# Patient Record
Sex: Male | Born: 1960 | Race: White | Hispanic: Yes | Marital: Married | Smoking: Never smoker
Health system: Southern US, Community
[De-identification: ages and names within clinical notes are randomized; demographics above are authoritative.]

## PROBLEM LIST (undated history)

## (undated) DIAGNOSIS — Z91148 Patient's other noncompliance with medication regimen for other reason: Secondary | ICD-10-CM

## (undated) DIAGNOSIS — Z9114 Patient's other noncompliance with medication regimen: Secondary | ICD-10-CM

## (undated) DIAGNOSIS — E119 Type 2 diabetes mellitus without complications: Secondary | ICD-10-CM

## (undated) DIAGNOSIS — I10 Essential (primary) hypertension: Secondary | ICD-10-CM

## (undated) DIAGNOSIS — R748 Abnormal levels of other serum enzymes: Secondary | ICD-10-CM

## (undated) DIAGNOSIS — E785 Hyperlipidemia, unspecified: Secondary | ICD-10-CM

## (undated) HISTORY — DX: Essential (primary) hypertension: I10

## (undated) HISTORY — DX: Patient's other noncompliance with medication regimen for other reason: Z91.148

## (undated) HISTORY — DX: Hyperlipidemia, unspecified: E78.5

## (undated) HISTORY — DX: Type 2 diabetes mellitus without complications: E11.9

## (undated) HISTORY — DX: Patient's other noncompliance with medication regimen: Z91.14

## (undated) HISTORY — DX: Abnormal levels of other serum enzymes: R74.8

## (undated) HISTORY — PX: OTHER SURGICAL HISTORY: SHX169

---

## 2002-10-21 ENCOUNTER — Emergency Department (HOSPITAL_COMMUNITY): Admission: EM | Admit: 2002-10-21 | Discharge: 2002-10-21 | Payer: Self-pay | Admitting: Emergency Medicine

## 2002-10-23 ENCOUNTER — Encounter: Admission: RE | Admit: 2002-10-23 | Discharge: 2002-10-23 | Payer: Self-pay | Admitting: Internal Medicine

## 2002-11-13 ENCOUNTER — Encounter: Admission: RE | Admit: 2002-11-13 | Discharge: 2002-11-13 | Payer: Self-pay | Admitting: Internal Medicine

## 2003-06-07 ENCOUNTER — Encounter: Admission: RE | Admit: 2003-06-07 | Discharge: 2003-06-07 | Payer: Self-pay | Admitting: Internal Medicine

## 2003-07-17 ENCOUNTER — Encounter: Admission: RE | Admit: 2003-07-17 | Discharge: 2003-07-17 | Payer: Self-pay | Admitting: Internal Medicine

## 2004-12-30 ENCOUNTER — Ambulatory Visit: Payer: Self-pay | Admitting: Internal Medicine

## 2005-05-05 ENCOUNTER — Ambulatory Visit: Payer: Self-pay | Admitting: Internal Medicine

## 2005-06-17 ENCOUNTER — Emergency Department (HOSPITAL_COMMUNITY): Admission: EM | Admit: 2005-06-17 | Discharge: 2005-06-18 | Payer: Self-pay | Admitting: Emergency Medicine

## 2005-06-22 ENCOUNTER — Ambulatory Visit: Payer: Self-pay | Admitting: Hospitalist

## 2005-07-05 ENCOUNTER — Ambulatory Visit: Payer: Self-pay | Admitting: Internal Medicine

## 2005-07-05 ENCOUNTER — Ambulatory Visit (HOSPITAL_COMMUNITY): Admission: RE | Admit: 2005-07-05 | Discharge: 2005-07-05 | Payer: Self-pay | Admitting: Internal Medicine

## 2006-03-22 DIAGNOSIS — E785 Hyperlipidemia, unspecified: Secondary | ICD-10-CM

## 2006-03-22 HISTORY — DX: Hyperlipidemia, unspecified: E78.5

## 2007-12-07 ENCOUNTER — Ambulatory Visit: Payer: Self-pay | Admitting: Internal Medicine

## 2007-12-07 ENCOUNTER — Ambulatory Visit: Payer: Self-pay | Admitting: *Deleted

## 2007-12-08 ENCOUNTER — Ambulatory Visit: Payer: Self-pay | Admitting: Family Medicine

## 2008-01-18 ENCOUNTER — Ambulatory Visit: Payer: Self-pay | Admitting: Internal Medicine

## 2008-01-18 LAB — CONVERTED CEMR LAB
ALT: 40 units/L (ref 0–53)
AST: 27 units/L (ref 0–37)
Albumin: 4.5 g/dL (ref 3.5–5.2)
Alkaline Phosphatase: 80 units/L (ref 39–117)
Basophils Relative: 1 % (ref 0–1)
Calcium: 9.3 mg/dL (ref 8.4–10.5)
Chloride: 102 meq/L (ref 96–112)
Cholesterol: 169 mg/dL (ref 0–200)
HDL: 35 mg/dL — ABNORMAL LOW (ref >39)
Lymphocytes Relative: 29 % (ref 12–46)
Lymphs Abs: 3.3 10*3/uL (ref 0.7–4.0)
MCV: 84.4 fL (ref 78.0–100.0)
Monocytes Relative: 6 % (ref 3–12)
Neutro Abs: 6 10*3/uL (ref 1.7–7.7)
Platelets: 228 10*3/uL (ref 150–400)
RBC: 5.7 M/uL (ref 4.22–5.81)
RDW: 14.4 % (ref 11.5–15.5)
Total CHOL/HDL Ratio: 4.8
Triglycerides: 304 mg/dL — ABNORMAL HIGH (ref <150)
WBC: 11.4 10*3/uL — ABNORMAL HIGH (ref 4.0–10.5)

## 2008-05-20 ENCOUNTER — Ambulatory Visit: Payer: Self-pay | Admitting: Internal Medicine

## 2008-07-18 ENCOUNTER — Ambulatory Visit: Payer: Self-pay | Admitting: Internal Medicine

## 2008-08-07 ENCOUNTER — Ambulatory Visit: Payer: Self-pay | Admitting: Family Medicine

## 2009-03-11 ENCOUNTER — Ambulatory Visit: Payer: Self-pay | Admitting: Internal Medicine

## 2009-03-11 LAB — CONVERTED CEMR LAB
BUN: 7 mg/dL (ref 6–23)
CO2: 25 meq/L (ref 19–32)
Chloride: 100 meq/L (ref 96–112)
Creatinine, Ser: 0.73 mg/dL (ref 0.40–1.50)
Glucose, Bld: 207 mg/dL — ABNORMAL HIGH (ref 70–99)
Hgb A1c MFr Bld: 8.8 % — ABNORMAL HIGH (ref 4.6–6.1)
LDL Cholesterol: 111 mg/dL — ABNORMAL HIGH (ref 0–99)
Total CHOL/HDL Ratio: 4.7

## 2009-06-23 ENCOUNTER — Ambulatory Visit: Payer: Self-pay | Admitting: Internal Medicine

## 2009-10-17 ENCOUNTER — Ambulatory Visit: Payer: Self-pay | Admitting: Internal Medicine

## 2009-10-17 LAB — CONVERTED CEMR LAB
Chloride: 104 meq/L (ref 96–112)
Creatinine, Ser: 0.86 mg/dL (ref 0.40–1.50)
Glucose, Bld: 159 mg/dL — ABNORMAL HIGH (ref 70–99)

## 2012-03-22 DIAGNOSIS — I10 Essential (primary) hypertension: Secondary | ICD-10-CM

## 2012-03-22 HISTORY — DX: Essential (primary) hypertension: I10

## 2012-04-18 ENCOUNTER — Emergency Department (HOSPITAL_COMMUNITY)
Admission: EM | Admit: 2012-04-18 | Discharge: 2012-04-18 | Disposition: A | Discharge status: Home / Self Care | Payer: No Typology Code available for payment source | Source: Home / Self Care | Attending: Family Medicine | Admitting: Family Medicine

## 2012-04-18 ENCOUNTER — Encounter (HOSPITAL_COMMUNITY): Payer: Self-pay

## 2012-04-18 DIAGNOSIS — E119 Type 2 diabetes mellitus without complications: Secondary | ICD-10-CM

## 2012-04-18 DIAGNOSIS — I10 Essential (primary) hypertension: Secondary | ICD-10-CM

## 2012-04-18 DIAGNOSIS — E8881 Metabolic syndrome: Secondary | ICD-10-CM

## 2012-04-18 DIAGNOSIS — Z23 Encounter for immunization: Secondary | ICD-10-CM

## 2012-04-18 DIAGNOSIS — K029 Dental caries, unspecified: Secondary | ICD-10-CM

## 2012-04-18 LAB — COMPREHENSIVE METABOLIC PANEL
ALT: 46 U/L (ref 0–53)
AST: 30 U/L (ref 0–37)
Albumin: 4.1 g/dL (ref 3.5–5.2)
BUN: 10 mg/dL (ref 6–23)
CO2: 24 mEq/L (ref 19–32)
Chloride: 98 mEq/L (ref 96–112)
Creatinine, Ser: 0.7 mg/dL (ref 0.50–1.35)
Total Protein: 7.7 g/dL (ref 6.0–8.3)

## 2012-04-18 LAB — LIPID PANEL
Cholesterol: 189 mg/dL (ref 0–200)
HDL: 37 mg/dL — ABNORMAL LOW (ref >39)
LDL Cholesterol: 81 mg/dL (ref 0–99)
Total CHOL/HDL Ratio: 5.1 RATIO
VLDL: 71 mg/dL — ABNORMAL HIGH (ref 0–40)

## 2012-04-18 LAB — TSH: TSH: 1.368 u[IU]/mL (ref 0.350–4.500)

## 2012-04-18 MED ORDER — INFLUENZA VIRUS VACC SPLIT PF IM SUSP
0.5000 mL | Freq: Once | INTRAMUSCULAR | Status: AC
  Administered 2012-04-18: 0.5 mL via INTRAMUSCULAR

## 2012-04-18 MED ORDER — LISINOPRIL-HYDROCHLOROTHIAZIDE 10-12.5 MG PO TABS: 1.0000 | ORAL_TABLET | Freq: Every day | ORAL | Status: DC

## 2012-04-18 MED ORDER — METFORMIN HCL 1000 MG PO TABS: 1000.0000 mg | ORAL_TABLET | Freq: Two times a day (BID) | ORAL | Status: DC

## 2012-04-18 MED ORDER — GLIPIZIDE ER 10 MG PO TB24: 10.0000 mg | ORAL_TABLET | Freq: Every day | ORAL | Status: DC

## 2012-04-18 NOTE — ED Notes (Signed)
Referral faxed to [facility]- Waiting for an appt. 

## 2012-04-18 NOTE — ED Provider Notes (Signed)
History    CSN: [id number]  Arrival date & time 04/18/12  1012   First MD Initiated Contact with Patient 04/18/12 1019     Chief Complaint  Patient presents with  . Diabetes   The history is provided by the patient. The history is limited by a language barrier. A language interpreter was used.  Pt reported that he was taking glipizide and glucotrol and metformin.  He says this was prescribed by MD.  Pt did not bring any medical records with him today.  He has not been testing his blood glucose regularly.  He is not able to tell me what his blood sugars have been running.  The patient reports that he has a need for his medications.  He is completely out of both medications.  History reviewed. No pertinent past medical history.  History reviewed. No pertinent past surgical history.  No family history on file.  History  Substance Use Topics  . Smoking status: Not on file  . Smokeless tobacco: Not on file  . Alcohol Use: Not on file    Review of Systems  HENT: Positive for congestion.   Musculoskeletal: Positive for arthralgias.  Psychiatric/Behavioral: Positive for behavioral problems.  All other systems reviewed and are negative.    Allergies  Review of patient's allergies indicates no known allergies.  Home Medications   Current Outpatient Rx  Name  Route  Sig  Dispense  Refill  . GLUCOTROL PO   Oral   Take by mouth.         Marland Kitchen GLIPIZIDE PO   Oral   Take by mouth.           BP 147/91  Pulse 73  Temp 97.3 F (36.3 C) (Oral)  Resp 16  SpO2 95%  Physical Exam  Nursing note and vitals reviewed. Constitutional: He is oriented to person, place, and time. He appears well-developed and well-nourished. No distress.  HENT:  Head: Normocephalic and atraumatic.  Eyes: Conjunctivae normal and EOM are normal. Pupils are equal, round, and reactive to light.  Neck: Normal range of motion. Neck supple.  Cardiovascular: Normal rate, regular rhythm and normal heart  sounds.   Pulmonary/Chest: Effort normal and breath sounds normal.  Abdominal: Soft. Bowel sounds are normal.  Musculoskeletal: Normal range of motion. He exhibits no edema and no tenderness.  Neurological: He is alert and oriented to person, place, and time.  Skin: Skin is warm and dry.  Psychiatric: He has a normal mood and affect. His behavior is normal. Judgment and thought content normal.    ED Course  Procedures (including critical care time)  Labs Reviewed - No data to display No results found.  No diagnosis found.  MDM  IMPRESSION  Type 2 Diabetes Mellitus  Hypertension  Metabolic Syndrome   RECOMMENDATIONS / PLAN Check labs today Check A1c Metformin 1000 mg po bidac  Glucotrol XL 10 mg po daily Request Old Records Strongly encouraged glucose monitoring  FOLLOW UP 2 months  The patient was given clear instructions to go to ER or return to medical center if symptoms don't improve, worsen or new problems develop.  The patient verbalized understanding.  The patient was told to call to get lab results if they haven't heard anything in the next week.            Cleora Fleet, MD 04/18/12 1825

## 2012-04-18 NOTE — ED Notes (Signed)
[facility] history of DM needs medication refill

## 2012-04-19 NOTE — Progress Notes (Signed)
Quick Note:  Please notify patient that his diabetes is poorly controlled as evidenced by hemoglobin A1c of greater than 12%. This patient is at high-risk for acute and chronic complications of poorly controlled diabetes mellitus. It is time to think about starting the patient on insulin therapy. I would like for the patient to take his medications as prescribed at this time. I would like for him to return to the clinic in one month to be reassessed. If his blood sugars have not improved significantly then I recommend starting insulin to get his blood sugars under better control. I recommend the patient check his blood sugars 3 times per day. Write down the numbers and call our office in 10 days with his blood glucose readings. Low carbohydrate diet recommended. No concentrated sweets. No white rice. Avoid large amounts of white bread. Please refer patient to dietitian. Follow up in 1 month.    Rodney Langton, MD, CDE, FAAFP [facility] [place], [place]   ______

## 2012-10-13 ENCOUNTER — Ambulatory Visit (INDEPENDENT_AMBULATORY_CARE_PROVIDER_SITE_OTHER): Payer: No Typology Code available for payment source | Admitting: Cardiology

## 2012-10-13 ENCOUNTER — Encounter: Payer: Self-pay | Admitting: Cardiology

## 2012-10-13 VITALS — BP 134/90 | HR 88 | Ht 68.0 in | Wt 202.1 lb

## 2012-10-13 DIAGNOSIS — E785 Hyperlipidemia, unspecified: Secondary | ICD-10-CM

## 2012-10-13 DIAGNOSIS — R079 Chest pain, unspecified: Secondary | ICD-10-CM

## 2012-10-13 DIAGNOSIS — K0889 Other specified disorders of teeth and supporting structures: Secondary | ICD-10-CM | POA: Insufficient documentation

## 2012-10-13 DIAGNOSIS — R6883 Chills (without fever): Secondary | ICD-10-CM | POA: Insufficient documentation

## 2012-10-13 DIAGNOSIS — I10 Essential (primary) hypertension: Secondary | ICD-10-CM | POA: Insufficient documentation

## 2012-10-13 DIAGNOSIS — R739 Hyperglycemia, unspecified: Secondary | ICD-10-CM | POA: Insufficient documentation

## 2012-10-13 DIAGNOSIS — Z9114 Patient's other noncompliance with medication regimen: Secondary | ICD-10-CM | POA: Insufficient documentation

## 2012-10-13 DIAGNOSIS — E1165 Type 2 diabetes mellitus with hyperglycemia: Secondary | ICD-10-CM | POA: Insufficient documentation

## 2012-10-13 DIAGNOSIS — R748 Abnormal levels of other serum enzymes: Secondary | ICD-10-CM | POA: Insufficient documentation

## 2012-10-13 NOTE — Assessment & Plan Note (Signed)
Symptoms atypical but multiple risk factors. Schedule stress echocardiogram for risk stratification.

## 2012-10-13 NOTE — Patient Instructions (Addendum)
Your physician recommends that you schedule a follow-up appointment in: AS NEEDED PENDING TEST RESULTS  Your physician has requested that you have a stress echocardiogram. For further information please visit [email or web address]. Please follow instruction sheet as given.    

## 2012-10-13 NOTE — Assessment & Plan Note (Signed)
Given history of diabetes mellitus he would benefit from a statin but I will leave this to her primary care.

## 2012-10-13 NOTE — Progress Notes (Signed)
  HPI: 52 year old male for evaluation of chest pain. Note the history is obtained with the assistance of his daughter. He speaks limited [place] as he is from [place]. He has no prior cardiac history. In the past 3 months he has had 3 separate episodes of chest pain. The pain is under the left breast and described as a stabbing pain. It lasts approximately 5-10 minutes and resolves with moving his arms. It is not exertional. There is associated diaphoresis and dyspnea but no nausea. He does not have exertional chest pain and there is no dyspnea on exertion, orthopnea or pedal edema.  Current Outpatient Prescriptions  Medication Sig Dispense Refill  . aspirin 81 MG tablet Take 81 mg by mouth daily.      Marland Kitchen glipiZIDE (GLUCOTROL XL) 10 MG 24 hr tablet Take 1 tablet (10 mg total) by mouth daily.  30 tablet  3  . lisinopril-hydrochlorothiazide (PRINZIDE,ZESTORETIC) 10-12.5 MG per tablet Take 1 tablet by mouth daily.  30 tablet  3  . metFORMIN (GLUCOPHAGE) 1000 MG tablet Take 1 tablet (1,000 mg total) by mouth 2 (two) times daily with a meal.  60 tablet  3   No current facility-administered medications for this visit.    No Known Allergies  Past Medical History  Diagnosis Date  . Hypertension 2014    3 months  . Dyslipidemia 2008  . Elevated liver enzymes   . History of medication noncompliance   . Type II diabetes mellitus 7-8 years    poorly controlled    Past Surgical History  Procedure Laterality Date  . Excision ingrown toenail      partial    History   Social History  . Marital Status: Married    Spouse Name: N/A    Number of Children: 3  . Years of Education: N/A   Occupational History  .     Social History Main Topics  . Smoking status: Former Games developer  . Smokeless tobacco: Not on file  . Alcohol Use: Yes     Comment: occasional  . Drug Use: No  . Sexually Active: Not on file   Other Topics Concern  . Not on file   Social History Narrative  . No narrative on file      Family History  Problem Relation Age of Onset  . Heart disease      No family history    ROS: no fevers or chills, productive cough, hemoptysis, dysphasia, odynophagia, melena, hematochezia, dysuria, hematuria, rash, seizure activity, orthopnea, PND, pedal edema, claudication. Remaining systems are negative.  Physical Exam:   Blood pressure 134/90, pulse 88, height 5\' 8"  (1.727 m), weight 202 lb 1.9 oz (91.681 kg), SpO2 96.00%.  General:  Well developed/well nourished in NAD Skin warm/dry Patient not depressed No peripheral clubbing Back-normal HEENT-normal/normal eyelids Neck supple/normal carotid upstroke bilaterally; no bruits; no JVD; no thyromegaly chest - CTA/ normal expansion CV - RRR/normal S1 and S2; no murmurs, rubs or gallops;  PMI nondisplaced Abdomen -NT/ND, no HSM, no mass, + bowel sounds, no bruit 2+ femoral pulses, no bruits Ext-no edema, chords, 2+ DP Neuro-grossly nonfocal  ECG sinus rhythm at a rate of 76. No ST changes.

## 2012-10-13 NOTE — Assessment & Plan Note (Signed)
Continue present medications. 

## 2012-10-16 ENCOUNTER — Ambulatory Visit (HOSPITAL_COMMUNITY): Payer: Self-pay | Attending: Cardiology

## 2012-10-16 ENCOUNTER — Ambulatory Visit (HOSPITAL_BASED_OUTPATIENT_CLINIC_OR_DEPARTMENT_OTHER): Payer: Self-pay | Admitting: Radiology

## 2012-10-16 ENCOUNTER — Other Ambulatory Visit (HOSPITAL_COMMUNITY): Payer: Self-pay | Admitting: *Deleted

## 2012-10-16 ENCOUNTER — Encounter: Payer: Self-pay | Admitting: Cardiology

## 2012-10-16 DIAGNOSIS — R5381 Other malaise: Secondary | ICD-10-CM | POA: Insufficient documentation

## 2012-10-16 DIAGNOSIS — R079 Chest pain, unspecified: Secondary | ICD-10-CM

## 2012-10-16 DIAGNOSIS — R0989 Other specified symptoms and signs involving the circulatory and respiratory systems: Secondary | ICD-10-CM

## 2012-10-16 DIAGNOSIS — R072 Precordial pain: Secondary | ICD-10-CM

## 2012-10-16 DIAGNOSIS — R61 Generalized hyperhidrosis: Secondary | ICD-10-CM | POA: Insufficient documentation

## 2012-10-16 DIAGNOSIS — R110 Nausea: Secondary | ICD-10-CM | POA: Insufficient documentation

## 2012-10-16 DIAGNOSIS — E785 Hyperlipidemia, unspecified: Secondary | ICD-10-CM | POA: Insufficient documentation

## 2012-10-16 DIAGNOSIS — I10 Essential (primary) hypertension: Secondary | ICD-10-CM | POA: Insufficient documentation

## 2012-10-16 DIAGNOSIS — E669 Obesity, unspecified: Secondary | ICD-10-CM | POA: Insufficient documentation

## 2012-10-16 DIAGNOSIS — R5383 Other fatigue: Secondary | ICD-10-CM | POA: Insufficient documentation

## 2012-10-16 DIAGNOSIS — E119 Type 2 diabetes mellitus without complications: Secondary | ICD-10-CM | POA: Insufficient documentation

## 2012-10-16 DIAGNOSIS — R000 Tachycardia, unspecified: Secondary | ICD-10-CM | POA: Insufficient documentation

## 2012-10-16 MED ORDER — PERFLUTREN PROTEIN A MICROSPH IV SUSP
0.5000 mL | Freq: Once | INTRAVENOUS | Status: AC
  Administered 2012-10-16: 3 mL via INTRAVENOUS

## 2012-10-16 NOTE — Progress Notes (Signed)
Stress Echocardiogram performed with optison.  

## 2012-10-24 ENCOUNTER — Ambulatory Visit: Payer: Self-pay

## 2013-06-14 ENCOUNTER — Emergency Department (HOSPITAL_COMMUNITY)
Admission: EM | Admit: 2013-06-14 | Discharge: 2013-06-14 | Disposition: A | Discharge status: Home / Self Care | Payer: PRIVATE HEALTH INSURANCE | Source: Home / Self Care | Attending: Family Medicine | Admitting: Family Medicine

## 2013-06-14 ENCOUNTER — Encounter (HOSPITAL_COMMUNITY): Payer: Self-pay | Admitting: Emergency Medicine

## 2013-06-14 DIAGNOSIS — J40 Bronchitis, not specified as acute or chronic: Secondary | ICD-10-CM

## 2013-06-14 MED ORDER — TRAMADOL HCL 50 MG PO TABS: 50.0000 mg | ORAL_TABLET | Freq: Every evening | ORAL | Status: DC | PRN

## 2013-06-14 MED ORDER — IPRATROPIUM-ALBUTEROL 0.5-2.5 (3) MG/3ML IN SOLN
3.0000 mL | Freq: Once | RESPIRATORY_TRACT | Status: AC
  Administered 2013-06-14: 3 mL via RESPIRATORY_TRACT

## 2013-06-14 MED ORDER — PREDNISONE 10 MG PO TABS: 30.0000 mg | ORAL_TABLET | Freq: Every day | ORAL | Status: DC

## 2013-06-14 MED ORDER — IPRATROPIUM-ALBUTEROL 0.5-2.5 (3) MG/3ML IN SOLN
RESPIRATORY_TRACT | Status: AC
  Filled 2013-06-14: qty 3

## 2013-06-14 NOTE — Discharge Instructions (Signed)
Gracias por venir hoy.   Bronquitis (Bronchitis) La bronquitis es una inflamacin de las vas respiratorias que se extienden desde la trquea Lubrizol Corporationhasta los pulmones (bronquios). A menudo, la inflamacin produce la formacin de mucosidad, lo que genera tos. Si la inflamacin es grave, puede provocar falta de aire. CAUSAS  Las causas de la bronquitis pueden ser:   Infecciones virales.  Bacterias.  Humo del cigarrillo.  Alrgenos, contaminantes y otros irritantes. SIGNOS Y SNTOMAS  El sntoma ms habitual de la bronquitis es la tos frecuente con mucosidad. Otros sntomas son:  Grant RutsFiebre.  Dolores PepsiCoen el cuerpo.  Congestin en el pecho.  Escalofros.  Falta de aire.  Dolor de Advertising copywritergarganta. DIAGNSTICO  La bronquitis en general se diagnostica con la historia clnica y un examen fsico. En algunos casos se indican otros estudios, como radiografas, para Risk managerdescartar otras enfermedades.  TRATAMIENTO  Tal vez deba evitar el contacto con la causa del problema (por ejemplo, el cigarrillo). En algunos casos es Dentistnecesario administrar medicamentos. Estos pueden ser:  Antibiticos. Tal vez se los receten si la causa de la bronquitis es una bacteria.  Antitusivos. Tal vez se los receten para Eastman Kodakaliviar los sntomas de la tos.  Medicamentos inhalados. Tal vez se los receten para Museum/gallery conservatorliberar las vas respiratorias y Research officer, political partyfacilitar la respiracin.  Medicamentos con corticoides. Tal vez se los receten si tiene bronquitis recurrente (crnica). INSTRUCCIONES PARA EL CUIDADO EN EL HOGAR  Descanse lo suficiente.  Beba lquidos en abundancia para mantener la orina de color claro o amarillo plido (excepto que padezca una enfermedad que requiera la restriccin de lquidos). Tome mucho lquido para Restaurant manager, fast fooddisolver las secreciones y Statisticianevitar la deshidratacin.  Tome solo medicamentos de venta libre o recetados, segn las indicaciones del mdico.  Tome los antibiticos exclusivamente segn las indicaciones. Finalice la  prescripcin completa, aunque se sienta mejor.  Evite el humo de [address] Dud[address] Streetsegunda mano, los qumicos irritantes y los vapores fuertes. Estos agentes empeoran la bronquitis. Si es fumador, abandone el hbito. Considere el uso de goma de Theatre managermascar o la aplicacin de parches en la piel que contengan nicotina para aliviar los sntomas de abstinencia. Si deja de fumar, sus pulmones se curarn ms rpido.  Ponga un humidificador de vapor fro en la habitacin por la noche para humedecer el aire. Puede ayudarlo a aflojar la mucosidad. Cambie el agua del humidificador a diario. Tambin puede abrir el agua caliente de la ducha y sentarse en el bao con la puerta cerrada durante 5a7310minutos.  Concurra a las consultas de control con su mdico segn las indicaciones.  Lvese las manos con frecuencia para evitar contagiarse bronquitis nuevamente y para no extender la infeccin a Economistotras personas. SOLICITE ATENCIN MDICA SI: Los sntomas no mejoran despus de 1 semana de tratamiento.  SOLICITE ATENCIN MDICA DE INMEDIATO SI:  La fiebre aumenta.  Tiene escalofros.  Siente dolor en el pecho.  Le empeora la falta el aire.  La flema tiene Landingvillesangre.  Se desmaya.  Tiene vahdos.  Sufre un dolor intenso de Turkmenistancabeza.  Vomita repetidas veces. ASEGRESE DE QUE:   Comprende estas instrucciones.  Controlar su afeccin.  Recibir ayuda de inmediato si no mejora o si empeora. Document Released: 03/08/2005 Document Revised: 12/27/2012 [facility]Exit[facility] Patient Information 2014 [place]ExitCare, MarylandLLC.

## 2013-06-14 NOTE — ED Notes (Signed)
C/o cough and headache for three days  States he has congestion and pressure Does have a productive cough with white thick mucous.   No medications taking

## 2013-06-14 NOTE — ED Provider Notes (Signed)
Thomas Ryan is a 53 y.o. male who presents to Urgent Care today for cough congestion runny nose headache with mild shortness of breath and wheezing. This is been present over the past 3 days. The cough is mildly productive. He additionally notes ear congestion. He's had a few episodes of posttussive vomiting. The cough is interfering with sleep. Medications tried. Feels well otherwise.   Past Medical History  Diagnosis Date  . Hypertension 2014    3 months  . Dyslipidemia 2008  . Elevated liver enzymes   . History of medication noncompliance   . Type II diabetes mellitus 7-8 years    poorly controlled   History  Substance Use Topics  . Smoking status: Former Games developermoker  . Smokeless tobacco: Not on file  . Alcohol Use: Yes     Comment: occasional   ROS as above Medications: No current facility-administered medications for this encounter.   Current Outpatient Prescriptions  Medication Sig Dispense Refill  . aspirin 81 MG tablet Take 81 mg by mouth daily.      Marland Kitchen. glipiZIDE (GLUCOTROL XL) 10 MG 24 hr tablet Take 1 tablet (10 mg total) by mouth daily.  30 tablet  3  . lisinopril-hydrochlorothiazide (PRINZIDE,ZESTORETIC) 10-12.5 MG per tablet Take 1 tablet by mouth daily.  30 tablet  3  . metFORMIN (GLUCOPHAGE) 1000 MG tablet Take 1 tablet (1,000 mg total) by mouth 2 (two) times daily with a meal.  60 tablet  3    Exam:  BP 117/86  Pulse 96  Temp(Src) 98.6 F (37 C) (Oral)  Resp 20  SpO2 96% Gen: Well NAD HEENT: EOMI,  MMM tympanic membranes are occluded by cerumen bilaterally. Posterior pharynx is normal appearing Lungs: Normal work of breathing. CTABL Heart: RRR no MRG Abd: NABS, Soft. NT, ND Exts: Brisk capillary refill, warm and well perfused.   Patient was given DuoNeb nebulizer treatment, and felt much better. Additionally the cerumen was removed and his ears were normal appearing and felt better.  No results found for this or any previous visit (from the past 24  hour(s)). No results found.  Assessment and Plan: 53 y.o. male with bronchitis. Plan to treat with low-dose prednisone, and tramadol for cough. Followup as needed.  Discussed warning signs or symptoms. Please see discharge instructions. Patient expresses understanding.    Rodolph BongEvan S Yaqueline Gutter, MD 06/14/13 [phone]

## 2014-05-31 ENCOUNTER — Encounter (HOSPITAL_COMMUNITY): Payer: Self-pay

## 2014-05-31 ENCOUNTER — Emergency Department (INDEPENDENT_AMBULATORY_CARE_PROVIDER_SITE_OTHER)
Admission: EM | Admit: 2014-05-31 | Discharge: 2014-05-31 | Disposition: A | Discharge status: Other Acute Inpatient Hospital | Payer: No Typology Code available for payment source | Source: Home / Self Care | Attending: Family Medicine | Admitting: Family Medicine

## 2014-05-31 DIAGNOSIS — S76911A Strain of unspecified muscles, fascia and tendons at thigh level, right thigh, initial encounter: Secondary | ICD-10-CM

## 2014-05-31 DIAGNOSIS — M79674 Pain in right toe(s): Secondary | ICD-10-CM

## 2014-05-31 DIAGNOSIS — S91109S Unspecified open wound of unspecified toe(s) without damage to nail, sequela: Secondary | ICD-10-CM

## 2014-05-31 MED ORDER — TRAMADOL HCL 50 MG PO TABS: 50.0000 mg | ORAL_TABLET | Freq: Four times a day (QID) | ORAL | Status: DC | PRN

## 2014-05-31 MED ORDER — NAPROXEN 375 MG PO TABS: 375.0000 mg | ORAL_TABLET | Freq: Two times a day (BID) | ORAL | Status: DC

## 2014-05-31 NOTE — ED Provider Notes (Signed)
CSN: [id number]     Arrival date & time 05/31/14  1609 History   First MD Initiated Contact with Patient 05/31/14 1714     Chief Complaint  Patient presents with  . Rash   (Consider location/radiation/quality/duration/timing/severity/associated sxs/prior Treatment) HPI Comments: 54 year old male with type 2 diabetes mellitus has a complaint of right leg pain there are 2 sources of pain. The initial source is that of pain to the right great toe with air is a healing diabetic ulcer. He is being seen on regular basis and treatment is proceeding with that. Unfortunately he walks for about 10-12 hours a day at his job and this exacerbates the pain. His second complaint is pain in the right quadriceps. The more he asked to walk greater soreness he has in the anterior thigh. Denies pain in the knee or below the knee or ankle. There is no report of injury, fall or blunt trauma.   Past Medical History  Diagnosis Date  . Hypertension 2014    3 months  . Dyslipidemia 2008  . Elevated liver enzymes   . History of medication noncompliance   . Type II diabetes mellitus 7-8 years    poorly controlled   Past Surgical History  Procedure Laterality Date  . Excision ingrown toenail      partial   Family History  Problem Relation Age of Onset  . Heart disease      No family history   History  Substance Use Topics  . Smoking status: Former Games developer  . Smokeless tobacco: Not on file  . Alcohol Use: Yes     Comment: occasional    Review of Systems  Constitutional: Negative.   Respiratory: Negative.   Cardiovascular: Negative.   Musculoskeletal: Positive for myalgias.  Skin: Positive for wound.  Neurological: Negative.     Allergies  Review of patient's allergies indicates no known allergies.  Home Medications   Prior to Admission medications   Medication Sig Start Date End Date Taking? Authorizing Provider  aspirin 81 MG tablet Take 81 mg by mouth daily.   Yes Historical Provider, MD   glipiZIDE (GLUCOTROL XL) 10 MG 24 hr tablet Take 1 tablet (10 mg total) by mouth daily. 04/18/12  Yes Clanford Cyndie Mull, MD  metFORMIN (GLUCOPHAGE) 1000 MG tablet Take 1 tablet (1,000 mg total) by mouth 2 (two) times daily with a meal. 04/18/12  Yes Clanford Cyndie Mull, MD  naproxen (NAPROSYN) 375 MG tablet Take 1 tablet (375 mg total) by mouth 2 (two) times daily. 05/31/14   Hayden Rasmussen, NP  traMADol (ULTRAM) 50 MG tablet Take 1 tablet (50 mg total) by mouth every 6 (six) hours as needed. 05/31/14   Hayden Rasmussen, NP   BP 147/86 mmHg  Pulse 71  Temp(Src) 98.4 F (36.9 C) (Oral)  Resp 18  SpO2 96% Physical Exam  Constitutional: He is oriented to person, place, and time. He appears well-developed and well-nourished. No distress.  Neck: Normal range of motion. Neck supple.  Musculoskeletal: He exhibits no edema.  There is tenderness over the medial anterior thigh over the quadriceps muscle. No bony tenderness. The pain is exacerbated with extension of the knee particularly against resistance. There is no tenderness to the lateral aspect. No knee tenderness or swelling. There is no pain or tenderness between the knee and great toe.  Neurological: He is alert and oriented to person, place, and time. He exhibits normal muscle tone.  Skin: Skin is warm and dry.  Examination of the wound  of the toe reveals that he is wearing a circular offload Dr. Margart SicklesScholl's type bandage. There is no erythema, draining, purulence, bleeding. The wound is dry. Although the plantar aspect of the foot and the toes are hyperemic there is no evidence of cellulitis. No lymphangitis. No tenderness to the toe.  Psychiatric: He has a normal mood and affect.  Nursing note and vitals reviewed.   ED Course  Procedures (including critical care time) Labs Review Labs Reviewed - No data to display  Imaging Review No results found.   MDM   1. Great toe pain, right   2. Open toe wound, sequela   3. Muscle strain of thigh, right,  initial encounter    The patient walks for long hours each day with having the small ulcer to the right great toe fracture his gait. This affect and accommodation with excessive walking has produce pain in the right quadriceps muscle. Use heat, off for the next 3 days and follow-up with your doctor on Monday. Tramadol as directed Naprosyn as directed The right great toe does not appear to be infected and as a matter fact it looks as though it is healing well. No further treatment for that.    Hayden Rasmussenavid Veverly Larimer, NP 05/31/14 1758

## 2014-05-31 NOTE — Discharge Instructions (Signed)
Distensin muscular. (Muscle Strain) Stretches as demonstrated, heat Naprosyn and tramadol for pain as needed. Una distensin muscular es una lesin que se produce cuando un msculo se estira ms all de su largo normal. Cuando esto sucede, por lo general se desgarra un pequeo nmero de fibras musculares. La distensin muscular se califica en grados. Las distensiones de Museum/gallery conservatorprimer grado son aquellas en las cuales el desgarro y el dolor afectan a la menor cantidad de fibras musculares. Las distensiones de segundo y tercer grado involucran una proporcin cada vez mayor de desgarro y Engineer, miningdolor.  En general, la recuperacin de una distensin muscular tarda de 1 a 2semanas. La curacin completa tarda de 5 a 6semanas.  CAUSAS  Las distensiones musculares ocurren cuando se aplica una fuerza violenta y repentina sobre un msculo y este se estira demasiado. Esto puede ocurrir cuando se Hydrographic surveyorlevantan objetos, se practican deportes o en una cada.  FACTORES DE RIESGO La distensin muscular es especialmente comn en los atletas.  SIGNOS Y SNTOMAS En el lugar de la distensin muscular se puede presentar lo siguiente:  Dolor.  Moretones.  Hinchazn.  Dificultad para usar el msculo debido al dolor o a un funcionamiento anormal. DIAGNSTICO  El mdico le har un examen fsico y le har preguntas sobre sus antecedentes mdicos. TRATAMIENTO  Con frecuencia, el mejor tratamiento para una distensin muscular es el reposo, y la aplicacin de hielo y de compresas fras en la zona de la lesin.  INSTRUCCIONES PARA EL CUIDADO EN EL HOGAR   Use el mtodo PRICE (por sus siglas en ingls) de tratamiento para estimular la curacin durante los primeros 2 a 3das posteriores a la lesin. El mtodo PRICE implica lo siguiente:  Proteger al msculo de nuevas lesiones.  Limitar la actividad y Lawyerdescansar la parte del cuerpo lesionada.  Aplicar hielo a la lesin. Para hacerlo, ponga hielo en una bolsa plstica. Coloque una  toalla entre la piel y la bolsa de hielo. Luego aplique el hielo y djelo actuar de 15 a 20minutos por hora. Despus del Press photographertercer da, cambie a compresas de calor hmedo.  Comprimir la zona lesionada con una frula o venda elstica. Tenga cuidado de no ajustarla demasiado. Esto puede interferir con la circulacin sangunea o aumentar la hinchazn.  Mantener la zona lesionada por encima del nivel del corazn con la mayor frecuencia posible.  Utilice los medicamentos de venta libre o recetados para Primary school teachercalmar el dolor, el malestar o la fiebre, segn se lo indique el mdico.  Education officer, environmentalealizar un calentamiento antes de hacer ejercicio ayuda a prevenir distensiones musculares futuras. SOLICITE ATENCIN MDICA SI:   Siente un dolor cada vez ms intenso o hinchazn en la zona lesionada.  Siente adormecimiento, hormigueo o nota una prdida importante de fuerza en la zona lesionada. ASEGRESE DE QUE:   Comprende estas instrucciones.  Controlar su afeccin.  Recibir ayuda de inmediato si no mejora o si empeora. Document Released: 12/16/2004 Document Revised: 12/27/2012 [facility]Exit[facility] Patient Information 2015 [place] CityExitCare, MarylandLLC. This information is not intended to replace advice given to you by your health care provider. Make sure you discuss any questions you have with your health care provider.

## 2014-05-31 NOTE — ED Notes (Signed)
States he has been under treatment for skin problem on foot for a while, and now is starting to have pain in his foot and leg. Pain '3' on 0-10 scale

## 2014-12-23 ENCOUNTER — Other Ambulatory Visit: Payer: Self-pay | Admitting: Internal Medicine

## 2014-12-24 LAB — HGB A1C W/O EAG: Hgb A1c MFr Bld: 12.3 % — ABNORMAL HIGH (ref 4.8–5.6)

## 2014-12-24 LAB — COMPREHENSIVE METABOLIC PANEL
A/G RATIO: 1.6 (ref 1.1–2.5)
ALBUMIN: 4.3 g/dL (ref 3.5–5.5)
ALT: 70 IU/L — ABNORMAL HIGH (ref 0–44)
AST: 38 IU/L (ref 0–40)
Alkaline Phosphatase: 115 IU/L (ref 39–117)
BUN / CREAT RATIO: 14 (ref 9–20)
BUN: 10 mg/dL (ref 6–24)
Bilirubin Total: 0.5 mg/dL (ref 0.0–1.2)
CALCIUM: 9.3 mg/dL (ref 8.7–10.2)
CO2: 23 mmol/L (ref 18–29)
CREATININE: 0.71 mg/dL — AB (ref 0.76–1.27)
Chloride: 98 mmol/L (ref 97–108)
GFR, EST AFRICAN AMERICAN: 123 mL/min/{1.73_m2} (ref >59)
GFR, EST NON AFRICAN AMERICAN: 106 mL/min/{1.73_m2} (ref >59)
GLOBULIN, TOTAL: 2.7 g/dL (ref 1.5–4.5)
Glucose: 289 mg/dL — ABNORMAL HIGH (ref 65–99)
Potassium: 4.5 mmol/L (ref 3.5–5.2)
SODIUM: 138 mmol/L (ref 134–144)
TOTAL PROTEIN: 7 g/dL (ref 6.0–8.5)

## 2014-12-24 LAB — MICROALBUMIN, URINE: MICROALBUM., U, RANDOM: 5.9 ug/mL

## 2015-01-30 ENCOUNTER — Encounter: Payer: Self-pay | Admitting: Internal Medicine

## 2015-01-30 ENCOUNTER — Ambulatory Visit (INDEPENDENT_AMBULATORY_CARE_PROVIDER_SITE_OTHER): Payer: Self-pay | Admitting: Internal Medicine

## 2015-01-30 VITALS — BP 140/88 | Ht 64.0 in | Wt 206.0 lb

## 2015-01-30 DIAGNOSIS — E1165 Type 2 diabetes mellitus with hyperglycemia: Secondary | ICD-10-CM

## 2015-01-30 DIAGNOSIS — I10 Essential (primary) hypertension: Secondary | ICD-10-CM

## 2015-01-30 DIAGNOSIS — B9789 Other viral agents as the cause of diseases classified elsewhere: Secondary | ICD-10-CM

## 2015-01-30 DIAGNOSIS — J029 Acute pharyngitis, unspecified: Secondary | ICD-10-CM

## 2015-01-30 DIAGNOSIS — J028 Acute pharyngitis due to other specified organisms: Secondary | ICD-10-CM

## 2015-01-30 LAB — GLUCOSE, POCT (MANUAL RESULT ENTRY): POC Glucose: 192 mg/dl — AB (ref 70–99)

## 2015-01-30 MED ORDER — GLIPIZIDE 5 MG PO TABS: 5.0000 mg | ORAL_TABLET | Freq: Two times a day (BID) | ORAL | Status: DC

## 2015-01-30 MED ORDER — LISINOPRIL-HYDROCHLOROTHIAZIDE 10-12.5 MG PO TABS: 1.0000 | ORAL_TABLET | Freq: Every day | ORAL | Status: DC

## 2015-01-30 MED ORDER — METFORMIN HCL 1000 MG PO TABS: 1000.0000 mg | ORAL_TABLET | Freq: Two times a day (BID) | ORAL | Status: DC

## 2015-01-30 NOTE — Patient Instructions (Addendum)
Drink a glass of water before every meal Drink 6-8 glasses of water daily Eat three meals daily Eat a protein and healthy fat with every meal (eggs,fish, chicken, Malawiturkey and limit red meats) Eat 5 servings of vegetables daily, mix the colors Eat 2 servings of fruit daily with skin, if skin is edible Use smaller plates Put food/utensils down as you chew and swallow each bite Eat at a table with friends/family at least once daily, no TV Do not eat in front of the TV  For sore throat:  Coricidin HBP para tos Ibuprofen 200 mg pastillas, 2-4 pastillas cada 6 horas a necesita dolor de garganta Bebe mucho agua

## 2015-01-30 NOTE — Progress Notes (Signed)
   Subjective:    Patient ID: Thomas Ryan, male    DOB: 1960/07/28, 54 y.o.   MRN: [id number]  HPI   1.  DM:  Poorly controlled DM.  A1C with last visit beginning of October was 12.3%.  Urine microalbumin 5.9 (ok) Discussed this is very poorly controlled, as expected.   Current Diet:   Water, diet soda every other day.  Coffee with sugar twice daily.  Sometimes milk  2 meals daily.  1st at 11 a.m.:  Rice sometimes with beans, red meat or chicken Snacks:  Cookies, bread (pan dulce), cereal--sugar coated Dinner in evening:  Coffee and pan dulce. Checks sugars intermittently. States he is taking his medicine regularly but only for past 2 weeks.    2.  Sore throat started night before last.  No fever.  Hoarse voice this morning.  Denies itchy, water, eyes or nose.  Clearing throat with minimal cough.  Cough hurts his chest.  Wife states did not cough much in night last night.      Review of Systems     Objective:   Physical Exam Congested and mildly hoarse HEENT:  PERRL, EOMI, conjunctivae without injection, TMs pearly gray, throat with posterior pharyngeal injection, no exudate, nasal mucosa swollen, red Neck:  Supple, no adenopathy Chest:  CTA CV:  RRR without murmur or rub, radial pulses normal and equal Abd:  S, NT, No HSM or masses.       Assessment & Plan:  1.  DM:  Long discussion with patient and wife about eating better and increasing physical activity.  Pt. Does not like veggies or fruits and wife is frustrated with him when she prepares his meals. Pt. Willing to try changing the way he eats and taking meds regularly. Follow up in 3-4 months with A1C after working on changes.  2.  Sore throat, cough:  Viral URI.  Symptomatic treatment with Coricidin HBP and Ibuprofen.

## 2015-05-08 ENCOUNTER — Telehealth: Payer: Self-pay | Admitting: Internal Medicine

## 2015-05-08 ENCOUNTER — Ambulatory Visit (INDEPENDENT_AMBULATORY_CARE_PROVIDER_SITE_OTHER): Payer: Self-pay | Admitting: Internal Medicine

## 2015-05-08 ENCOUNTER — Encounter: Payer: Self-pay | Admitting: Internal Medicine

## 2015-05-08 VITALS — BP 140/86 | HR 82 | Resp 16 | Ht 64.0 in | Wt 202.0 lb

## 2015-05-08 DIAGNOSIS — I10 Essential (primary) hypertension: Secondary | ICD-10-CM

## 2015-05-08 DIAGNOSIS — E785 Hyperlipidemia, unspecified: Secondary | ICD-10-CM

## 2015-05-08 DIAGNOSIS — R748 Abnormal levels of other serum enzymes: Secondary | ICD-10-CM

## 2015-05-08 DIAGNOSIS — E1165 Type 2 diabetes mellitus with hyperglycemia: Secondary | ICD-10-CM

## 2015-05-08 DIAGNOSIS — K029 Dental caries, unspecified: Secondary | ICD-10-CM

## 2015-05-08 LAB — GLUCOSE, POCT (MANUAL RESULT ENTRY): POC Glucose: 232 mg/dl — AB (ref 70–99)

## 2015-05-08 NOTE — Progress Notes (Signed)
   Subjective:    Patient ID: Thomas Ryan, male    DOB: 1960-09-04, 55 y.o.   MRN: [id number]  HPI   1.  DM II:  States he is letting his wife cook healthier foods for him.  However, last night had 2 pieces of pizza and dark chocolate.   States he is checking his sugars every two days fasting in the morning--generally 170-180 range. Sometimes, checks in the evening--1-2 hours and gets same range.  Does not sound like he does this very often.   No polydipsia or polyuria.   Did have an eye check last week and sounds like had diabetic eye changes.  He is being referred to a retinal specialist for laser treatment.  Was told did not need glasses.  Have not seen the report as of yet. He feels like he is doing better with his diabetes since November as he was having polydipsia and polyuria. Checking feet every day.  States his feet are good since treatment with Terbinafine last year. Influenza vaccine in November. Due for A1C today.   2.  Hypertension:  Does not sound like he is taking anything for his blood pressure.  States [facility] did not fill last time.  Cannot recall when he last filled.  Called pharmacy and has not filled since December 16th.  Not taking aspirin 81 mg as well.     3.  Insomnia:  Not sleeping well. Able to initiate sleep, but awakens after 4 hours and cannot get back to sleep. Goes to bed at 1-2 in the morning.  Works 2nd shift and gets home at 10:30 in the evening.  Gets out of bed at 7-7:30 in the morning.  Tosses and turns in bed when cannot get back to sleep.  He ultimately gets up and cleans.  Does not feel he has worries he is thinking about keeping him awake.    Review of Systems     Objective:   Physical Exam  Lungs:  CTA CV:  RRR with normal S1 and S2, No S3, S4, or murmur appreciated,  Radial, DP and PT pulses normal and equal Extrems:  No edema Feet:  No callous, skin and nails now fungal free.  Good cap refill.  No lesions or wounds.  10 g  monofilament testing normal      Assessment & Plan:  1.  DM  II: A1C and though had half a banana, will go ahead and check cholesterol today as well. Again, discussed his health is a team effort would like him to know what meds he is taking and why.   Wrote on discharge papers the four meds he should be taking and for what reason. Encouraged Pneumovax at [facility]  2.  Hypertension:  Needs to get his Lisinopril/HCTZ.  No change to med for now.  3.  Insomnia:  Discussed at length ways to improve sleep.  He is not to get up and clean or do busy work.  Asked him to read instead until sleepy.  4.  Diabetic eye changes:  Await report from [facility] and eye doctor.  Pt. Reportedly has been referred to a retinal specialist.  5.  Dental Decay  Check on status of referral to dentist

## 2015-05-08 NOTE — Patient Instructions (Signed)
Get to know your medicines and why you are taking them.

## 2015-05-09 LAB — COMPREHENSIVE METABOLIC PANEL
ALK PHOS: 105 IU/L (ref 39–117)
ALT: 52 IU/L — AB (ref 0–44)
AST: 32 IU/L (ref 0–40)
Albumin/Globulin Ratio: 1.7 (ref 1.1–2.5)
Albumin: 4.4 g/dL (ref 3.5–5.5)
BUN/Creatinine Ratio: 12 (ref 9–20)
BUN: 9 mg/dL (ref 6–24)
Bilirubin Total: 0.7 mg/dL (ref 0.0–1.2)
CALCIUM: 9.2 mg/dL (ref 8.7–10.2)
CO2: 22 mmol/L (ref 18–29)
CREATININE: 0.73 mg/dL — AB (ref 0.76–1.27)
Chloride: 96 mmol/L (ref 96–106)
GFR calc Af Amer: 122 mL/min/{1.73_m2} (ref >59)
GFR, EST NON AFRICAN AMERICAN: 105 mL/min/{1.73_m2} (ref >59)
GLUCOSE: 258 mg/dL — AB (ref 65–99)
Globulin, Total: 2.6 g/dL (ref 1.5–4.5)
Potassium: 4.5 mmol/L (ref 3.5–5.2)
Sodium: 137 mmol/L (ref 134–144)
Total Protein: 7 g/dL (ref 6.0–8.5)

## 2015-05-09 LAB — LIPID PANEL W/O CHOL/HDL RATIO
CHOLESTEROL TOTAL: 184 mg/dL (ref 100–199)
HDL: 38 mg/dL — ABNORMAL LOW (ref >39)
LDL Calculated: 106 mg/dL — ABNORMAL HIGH (ref 0–99)
Triglycerides: 199 mg/dL — ABNORMAL HIGH (ref 0–149)
VLDL CHOLESTEROL CAL: 40 mg/dL (ref 5–40)

## 2015-05-09 LAB — HGB A1C W/O EAG: HEMOGLOBIN A1C: 12.2 % — AB (ref 4.8–5.6)

## 2015-05-09 NOTE — Telephone Encounter (Signed)
Referral faxed to [facility]

## 2015-05-09 NOTE — Telephone Encounter (Signed)
Patient has not been referred to the Dentist. I checked in [place] and there is no Dental Referral there either. The only referral we have for him is for Diabetic Ophthalmology and it was faxed on 01/30/15

## 2015-06-06 ENCOUNTER — Encounter: Payer: Self-pay | Admitting: Internal Medicine

## 2015-06-06 ENCOUNTER — Ambulatory Visit (INDEPENDENT_AMBULATORY_CARE_PROVIDER_SITE_OTHER): Payer: Self-pay | Admitting: Internal Medicine

## 2015-06-06 VITALS — BP 146/90 | HR 80 | Temp 98.0°F | Resp 20 | Ht 64.0 in | Wt 204.0 lb

## 2015-06-06 DIAGNOSIS — G4730 Sleep apnea, unspecified: Secondary | ICD-10-CM

## 2015-06-06 DIAGNOSIS — E785 Hyperlipidemia, unspecified: Secondary | ICD-10-CM

## 2015-06-06 DIAGNOSIS — E1165 Type 2 diabetes mellitus with hyperglycemia: Secondary | ICD-10-CM

## 2015-06-06 DIAGNOSIS — J069 Acute upper respiratory infection, unspecified: Secondary | ICD-10-CM

## 2015-06-06 DIAGNOSIS — I10 Essential (primary) hypertension: Secondary | ICD-10-CM

## 2015-06-06 LAB — GLUCOSE, POCT (MANUAL RESULT ENTRY): POC Glucose: 191 mg/dl — AB (ref 70–99)

## 2015-06-06 MED ORDER — GLIPIZIDE 10 MG PO TABS: 10.0000 mg | ORAL_TABLET | Freq: Two times a day (BID) | ORAL | Status: DC

## 2015-06-06 MED ORDER — LISINOPRIL-HYDROCHLOROTHIAZIDE 20-12.5 MG PO TABS: ORAL_TABLET | ORAL | Status: DC

## 2015-06-06 MED ORDER — LOVASTATIN 20 MG PO TABS: 20.0000 mg | ORAL_TABLET | Freq: Every day | ORAL | Status: DC

## 2015-06-06 MED ORDER — GLIPIZIDE 10 MG PO TABS: 10.0000 mg | ORAL_TABLET | Freq: Every day | ORAL | Status: DC

## 2015-06-06 NOTE — Progress Notes (Signed)
   Subjective:    Patient ID: Thomas Ryan, male    DOB: 01/21/1961, 55 y.o.   MRN: [id number]  HPI   Son in law, Koleen Nimroddrian, a nursing student, here to interpret for pt. Today   1.  Ill for 6 days:  Started with a cough, runny nose, itchy, irritated throat.  No fever, nausea or vomiting.  No diarrhea.  Eating and drinking ok.  No dyspnea.  Not taking any OTC cold remedies--later clear he is utilizing Coricidin HBP and it helps.  He is gradually feeling a bit better.  2.  Type  2 DM:  A1C last month was not good at 12.2 % despite his report of relatively good glucose levels when he actually checks.  Is not very physically active, though physically active in his work.   Does snore a lot and stops breathing when he snores (son in law states this)  Works noon to 9 p.m.  Comes home and watches TV for hours until sleepy--snoozed on and off before going to bed.  Gets up around 8 am, up for a bit and then back to sleep until 11 a.m. Used to enjoy soccer and other physical activity, but not for some time. We have had many conversations regarding less than optimal diet as well.    3.  Essential Hypertension:  States taking Lisinopril/HCTZ daily.  Admits to not taking the med regularly prior to his last visit.    Current outpatient prescriptions:  .  aspirin 81 MG tablet, Take 81 mg by mouth daily., Disp: , Rfl:  .  glipiZIDE (GLUCOTROL) 5 MG tablet, Take 1 tablet (5 mg total) by mouth 2 (two) times daily before a meal., Disp: 60 tablet, Rfl: 11 .  lisinopril-hydrochlorothiazide (PRINZIDE,ZESTORETIC) 10-12.5 MG tablet, Take 1 tablet by mouth daily., Disp: 30 tablet, Rfl: 11 .  metFORMIN (GLUCOPHAGE) 1000 MG tablet, Take 1 tablet (1,000 mg total) by mouth 2 (two) times daily with a meal., Disp: 60 tablet, Rfl: 11 .  naproxen (NAPROSYN) 375 MG tablet, Take 1 tablet (375 mg total) by mouth 2 (two) times daily. (Patient not taking: Reported on 06/06/2015), Disp: 15 tablet, Rfl: 0 .  traMADol  (ULTRAM) 50 MG tablet, Take 1 tablet (50 mg total) by mouth every 6 (six) hours as needed. (Patient not taking: Reported on 06/06/2015), Disp: 15 tablet, Rfl: 0  No Known Allergies    Review of Systems     Objective:   Physical Exam   HEENT:  PERRL, EOMI, no conjunctival injection, TMs pearly gray, nasal turbinated swollen and red with clear nasal discharge, Posterior pharynx with mild injection, but no exudate. Neck:  Supple, no adenopaty Chest:  CTA CV:  RRR without murmur or rub, radial pulses normal and equal Abd:  S, NT, No HSM or masses, +BS         Assessment & Plan:  1.  URI:  Coricidin HBP.  To call if does not continue to gradually improve  2.  Type II DM:  Poorly controlled.  Tackling his sleep wake cycle and increasing physical activity first so as not to overwhelm patient.  He has been unable to get this under control for some time. Increase Glipizide to 10 mg twice daily   Continue Metformin 1000 mg twice daily    3.  Possible Sleep Apnea:  See if can get set up with sleep study.  4.  Essential Hypertension:  Increase to Lisinopril/HCTZ 20/12.5 mg in the morning

## 2015-06-06 NOTE — Patient Instructions (Signed)
No TV despues 11 p.m. Lee 11 pm a 11:30 p.m [place] 11:30 pm.  Up 8 a.m. todos los dias  Physical activity cada [employer]

## 2015-07-18 ENCOUNTER — Other Ambulatory Visit (INDEPENDENT_AMBULATORY_CARE_PROVIDER_SITE_OTHER): Payer: Self-pay | Admitting: Internal Medicine

## 2015-07-18 ENCOUNTER — Encounter: Payer: Self-pay | Admitting: Internal Medicine

## 2015-07-18 VITALS — BP 110/80 | HR 84

## 2015-07-18 DIAGNOSIS — E785 Hyperlipidemia, unspecified: Secondary | ICD-10-CM

## 2015-07-18 DIAGNOSIS — Z79899 Other long term (current) drug therapy: Secondary | ICD-10-CM

## 2015-07-18 DIAGNOSIS — R748 Abnormal levels of other serum enzymes: Secondary | ICD-10-CM

## 2015-07-18 NOTE — Progress Notes (Signed)
Patient ID: Thomas Ryan, male   DOB: 30-Sep-1960, 55 y.o.   MRN: [id number] Patient here for BP check and laboratory:  FLP, CMP. Blood drawn. States he is taking his meds, including Lovastatin as prescribed.

## 2015-07-19 LAB — COMPREHENSIVE METABOLIC PANEL
A/G RATIO: 1.9 (ref 1.2–2.2)
ALBUMIN: 4.4 g/dL (ref 3.5–5.5)
ALT: 41 IU/L (ref 0–44)
AST: 33 IU/L (ref 0–40)
Alkaline Phosphatase: 80 IU/L (ref 39–117)
BUN/Creatinine Ratio: 16 (ref 9–20)
BUN: 13 mg/dL (ref 6–24)
Bilirubin Total: 1.1 mg/dL (ref 0.0–1.2)
CO2: 24 mmol/L (ref 18–29)
CREATININE: 0.8 mg/dL (ref 0.76–1.27)
Calcium: 9.5 mg/dL (ref 8.7–10.2)
Chloride: 97 mmol/L (ref 96–106)
GFR, EST AFRICAN AMERICAN: 117 mL/min/{1.73_m2} (ref >59)
GFR, EST NON AFRICAN AMERICAN: 101 mL/min/{1.73_m2} (ref >59)
GLOBULIN, TOTAL: 2.3 g/dL (ref 1.5–4.5)
GLUCOSE: 191 mg/dL — AB (ref 65–99)
POTASSIUM: 4.7 mmol/L (ref 3.5–5.2)
SODIUM: 137 mmol/L (ref 134–144)
Total Protein: 6.7 g/dL (ref 6.0–8.5)

## 2015-07-19 LAB — LIPID PANEL W/O CHOL/HDL RATIO
Cholesterol, Total: 153 mg/dL (ref 100–199)
HDL: 39 mg/dL — AB (ref >39)
LDL CALC: 79 mg/dL (ref 0–99)
TRIGLYCERIDES: 175 mg/dL — AB (ref 0–149)
VLDL Cholesterol Cal: 35 mg/dL (ref 5–40)

## 2015-08-01 ENCOUNTER — Ambulatory Visit (INDEPENDENT_AMBULATORY_CARE_PROVIDER_SITE_OTHER): Payer: Self-pay | Admitting: Internal Medicine

## 2015-08-01 ENCOUNTER — Encounter: Payer: Self-pay | Admitting: Internal Medicine

## 2015-08-01 VITALS — BP 136/80 | HR 68 | Resp 17 | Ht 64.0 in | Wt 203.0 lb

## 2015-08-01 DIAGNOSIS — Z23 Encounter for immunization: Secondary | ICD-10-CM

## 2015-08-01 NOTE — Progress Notes (Signed)
   Subjective:    Patient ID: Thomas Ryan, male    DOB: 1960/05/30, 55 y.o.   MRN: [id number]  HPI   Here for follow up of labs  FAsting sugar today is 173  1.  Hyperlipidemia:  Stopped his cholesterol medicatio 1 week ago--did not refill.  Discussed his cholesterol while not at goal, is much better and he needs to get refilled.  STates he is trying to increase physical activity.  2.  Essential Hypertension:  BP better today.  Is taking medication regularly per patient  3.  DM:  Discussed need to work on diet and physical activity.  He has a history of poor compliance with meds and lifestyle.   Has had eye exam--states no problems found.  Was supposed to have follow, however, and orange card expired so did not go.  Sounds like he was supposed to go to a retinal specialist, but again, just did not go.   Influenza immunization current. Has not had Pneumovax States checking feet.    Current outpatient prescriptions:  .  aspirin 81 MG tablet, Take 81 mg by mouth daily., Disp: , Rfl:  .  glipiZIDE (GLUCOTROL) 10 MG tablet, Take 1 tablet (10 mg total) by mouth 2 (two) times daily before a meal., Disp: 60 tablet, Rfl: 11 .  lisinopril-hydrochlorothiazide (ZESTORETIC) 20-12.5 MG tablet, 1 tab by mouth in the morning, Disp: 30 tablet, Rfl: 11 .  metFORMIN (GLUCOPHAGE) 1000 MG tablet, Take 1 tablet (1,000 mg total) by mouth 2 (two) times daily with a meal., Disp: 60 tablet, Rfl: 11 .  lovastatin (MEVACOR) 20 MG tablet, Take 1 tablet (20 mg total) by mouth at bedtime. (Patient not taking: Reported on 08/01/2015), Disp: 30 tablet, Rfl: 11   No Known Allergies    Review of Systems     Objective:   Physical Exam NAD LUngs:  CTA CV:  RRR with normal S1 and S2, No S3, S4 or murmur.  Radial pulses normal and equal Abd:  S, NT, No HSM or masses, +BS LE:  No edema      Assessment & Plan:  1.  Noncompliance:  Continue to coach patient on his health responsibilities/staying current  with orange card.  2.  Hyperlipidemia;  Improved, though not quite at goal with Lovastatin.  Asked that he get restarted on Lovastatin.  3.  Essential Hypertension:  BP better.  4.  DM Type 2:  Continues to be a problem likely with both eating, lack of physical activity and skipping medication.  Long discussion about calling to cancel appts. Or we will no longer be able to make him specialty appts Will look into eye doctor visit and find out what he was supposed to do. Pneumococcal 23 V given today

## 2015-08-07 ENCOUNTER — Ambulatory Visit: Payer: No Typology Code available for payment source | Admitting: Internal Medicine

## 2015-11-06 ENCOUNTER — Encounter: Payer: Self-pay | Admitting: Internal Medicine

## 2015-11-06 ENCOUNTER — Ambulatory Visit (INDEPENDENT_AMBULATORY_CARE_PROVIDER_SITE_OTHER): Payer: No Typology Code available for payment source | Admitting: Internal Medicine

## 2015-11-06 VITALS — BP 128/80 | HR 76 | Resp 16 | Ht 64.0 in | Wt 201.0 lb

## 2015-11-06 DIAGNOSIS — Z9119 Patient's noncompliance with other medical treatment and regimen: Secondary | ICD-10-CM

## 2015-11-06 DIAGNOSIS — E1165 Type 2 diabetes mellitus with hyperglycemia: Secondary | ICD-10-CM

## 2015-11-06 DIAGNOSIS — E785 Hyperlipidemia, unspecified: Secondary | ICD-10-CM

## 2015-11-06 DIAGNOSIS — Z9114 Patient's other noncompliance with medication regimen: Secondary | ICD-10-CM

## 2015-11-06 DIAGNOSIS — I10 Essential (primary) hypertension: Secondary | ICD-10-CM

## 2015-11-06 LAB — GLUCOSE, POCT (MANUAL RESULT ENTRY): POC Glucose: 203 mg/dl — AB (ref 70–99)

## 2015-11-06 NOTE — Progress Notes (Signed)
   Subjective:    Patient ID: Thomas Ryan, male    DOB: Nov 11, 1960, 55 y.o.   MRN: [id number]  HPI   1.  DM Type 2:  Checking sugars only every 2-3 days.  Sugars never below 200.  Generally in 300s, which his last A1C  (12.2%)supports.  Describes a contiued poor diet with lots of white bread, fat.   Did not bring in meds today. He is generally taking Glipizide and Metformin twice daily. States if he eats the way he eats, even if he takes medication, his sugars will not be at goal. Long discussion of motivation and diet/physical activity.   2.  Hyperlipidemia:  Has been out of Lovastatin for 3 days.  Was closer to goal in April with last check after starting Lovastatin. Lipid Panel     Component Value Date/Time   CHOL 153 07/18/2015 0838   TRIG 175 (H) 07/18/2015 0838   HDL 39 (L) 07/18/2015 0838   CHOLHDL 5.1 04/18/2012 1109   VLDL 71 (H) 04/18/2012 1109   LDLCALC 79 07/18/2015 0838    3.  Low Back Pain:  This is what he seems most focused on:  [facility]  He was evaluated for AAA and dissection as well as UTI and kidney stone.  No findings to support any of those things with CT.  EKG/CIEs negative for ischemic changes.  Diagnosed with low back pain.  He states he is feeling much better now.   Pt. Received IV contrast and he was told to hold "all of his medications for 2 days".  Likely just told to hold Metformin.  Current Meds  Medication Sig  . aspirin 81 MG tablet Take 81 mg by mouth daily.  . cyclobenzaprine (FLEXERIL) 10 MG tablet Take 10 mg by mouth 2 (two) times daily as needed for muscle spasms.  Marland Kitchen. glipiZIDE (GLUCOTROL) 10 MG tablet Take 1 tablet (10 mg total) by mouth 2 (two) times daily before a meal.  . lisinopril-hydrochlorothiazide (ZESTORETIC) 20-12.5 MG tablet 1 tab by mouth in the morning  . lovastatin (MEVACOR) 20 MG tablet Take 1 tablet (20 mg total) by mouth at bedtime.  . metFORMIN (GLUCOPHAGE) 1000 MG tablet Take 1 tablet  (1,000 mg total) by mouth 2 (two) times daily with a meal.   No Known Allergies         Review of Systems     Objective:   Physical Exam NAD--moves about easily without pain Lungs:  CTA CV:  RRR without murmur or rub, radial pulses normal and equal Abd:  S, NT, No HSM or mass, + BS Back:  NT over spinous processes and paraspinous musculature,  Neuro:  LE:  Motor 5/5, DTRs unable to obtain, inability to relax legs.  Lab Results  Component Value Date   POCGLU 203 (A) 11/06/2015       Assessment & Plan:  1.  DM Type 2:  Patient has not made necessary changes to improve his condition.  Sugars are still in the 12.2% A1C range.  Will hold on checking labs for 3 months, have him really work on dietary changes.  2.  Hyperlipidemia:  Has been off med for a bit.  As above, will recheck FLP in 3 months.  3.  Essential Hypertension:  Controlled.  Continue Lisinopril/HCTZ  4.  Back pain with what historically sounds like radiculopathy:  Resolved.  Possibly related to mattress when on vacation.

## 2015-11-06 NOTE — Patient Instructions (Signed)

## 2016-02-06 ENCOUNTER — Other Ambulatory Visit (INDEPENDENT_AMBULATORY_CARE_PROVIDER_SITE_OTHER): Payer: Self-pay

## 2016-02-06 DIAGNOSIS — Z79899 Other long term (current) drug therapy: Secondary | ICD-10-CM

## 2016-02-06 DIAGNOSIS — E1165 Type 2 diabetes mellitus with hyperglycemia: Secondary | ICD-10-CM

## 2016-02-06 DIAGNOSIS — E785 Hyperlipidemia, unspecified: Secondary | ICD-10-CM

## 2016-02-07 LAB — COMPREHENSIVE METABOLIC PANEL
A/G RATIO: 1.5 (ref 1.2–2.2)
ALK PHOS: 93 IU/L (ref 39–117)
ALT: 53 IU/L — ABNORMAL HIGH (ref 0–44)
AST: 36 IU/L (ref 0–40)
Albumin: 4.2 g/dL (ref 3.5–5.5)
BILIRUBIN TOTAL: 1 mg/dL (ref 0.0–1.2)
BUN/Creatinine Ratio: 11 (ref 9–20)
BUN: 9 mg/dL (ref 6–24)
CALCIUM: 9.4 mg/dL (ref 8.7–10.2)
CHLORIDE: 95 mmol/L — AB (ref 96–106)
CO2: 25 mmol/L (ref 18–29)
Creatinine, Ser: 0.85 mg/dL (ref 0.76–1.27)
GFR calc Af Amer: 113 mL/min/{1.73_m2} (ref >59)
GFR, EST NON AFRICAN AMERICAN: 98 mL/min/{1.73_m2} (ref >59)
GLOBULIN, TOTAL: 2.8 g/dL (ref 1.5–4.5)
Glucose: 249 mg/dL — ABNORMAL HIGH (ref 65–99)
POTASSIUM: 4.5 mmol/L (ref 3.5–5.2)
SODIUM: 137 mmol/L (ref 134–144)
Total Protein: 7 g/dL (ref 6.0–8.5)

## 2016-02-07 LAB — LIPID PANEL
CHOL/HDL RATIO: 4.2 ratio (ref 0.0–5.0)
Cholesterol, Total: 168 mg/dL (ref 100–199)
HDL: 40 mg/dL (ref >39)
LDL Calculated: 100 mg/dL — ABNORMAL HIGH (ref 0–99)
TRIGLYCERIDES: 140 mg/dL (ref 0–149)
VLDL Cholesterol Cal: 28 mg/dL (ref 5–40)

## 2016-02-07 LAB — HEMOGLOBIN A1C
Est. average glucose Bld gHb Est-mCnc: 255 mg/dL
Hgb A1c MFr Bld: 10.5 % — ABNORMAL HIGH (ref 4.8–5.6)

## 2016-02-07 LAB — MICROALBUMIN / CREATININE URINE RATIO
CREATININE, UR: 134.4 mg/dL
MICROALBUM., U, RANDOM: 10.2 ug/mL
Microalb/Creat Ratio: 7.6 mg/g creat (ref 0.0–30.0)

## 2016-02-12 ENCOUNTER — Ambulatory Visit: Payer: Self-pay | Admitting: Internal Medicine

## 2016-02-23 ENCOUNTER — Other Ambulatory Visit: Payer: Self-pay | Admitting: Internal Medicine

## 2016-02-23 ENCOUNTER — Other Ambulatory Visit: Payer: Self-pay

## 2016-02-23 DIAGNOSIS — E1165 Type 2 diabetes mellitus with hyperglycemia: Secondary | ICD-10-CM

## 2016-02-23 MED ORDER — METFORMIN HCL 1000 MG PO TABS: 1000.0000 mg | ORAL_TABLET | Freq: Two times a day (BID) | ORAL | 11 refills | Status: DC

## 2016-02-23 NOTE — Telephone Encounter (Signed)
rx faxed to pharmacy

## 2016-03-04 ENCOUNTER — Ambulatory Visit: Payer: Self-pay | Admitting: Internal Medicine

## 2016-05-31 ENCOUNTER — Other Ambulatory Visit (INDEPENDENT_AMBULATORY_CARE_PROVIDER_SITE_OTHER): Payer: Self-pay

## 2016-05-31 DIAGNOSIS — E1165 Type 2 diabetes mellitus with hyperglycemia: Secondary | ICD-10-CM

## 2016-05-31 DIAGNOSIS — E785 Hyperlipidemia, unspecified: Secondary | ICD-10-CM

## 2016-05-31 DIAGNOSIS — R748 Abnormal levels of other serum enzymes: Secondary | ICD-10-CM

## 2016-06-01 ENCOUNTER — Telehealth: Payer: Self-pay | Admitting: Internal Medicine

## 2016-06-01 LAB — LIPID PANEL W/O CHOL/HDL RATIO
CHOLESTEROL TOTAL: 170 mg/dL (ref 100–199)
HDL: 38 mg/dL — AB (ref >39)
LDL Calculated: 109 mg/dL — ABNORMAL HIGH (ref 0–99)
Triglycerides: 113 mg/dL (ref 0–149)
VLDL CHOLESTEROL CAL: 23 mg/dL (ref 5–40)

## 2016-06-01 LAB — HEPATIC FUNCTION PANEL
ALT: 40 IU/L (ref 0–44)
AST: 30 IU/L (ref 0–40)
Albumin: 4.2 g/dL (ref 3.5–5.5)
Alkaline Phosphatase: 93 IU/L (ref 39–117)
BILIRUBIN TOTAL: 0.8 mg/dL (ref 0.0–1.2)
Bilirubin, Direct: 0.21 mg/dL (ref 0.00–0.40)
Total Protein: 6.8 g/dL (ref 6.0–8.5)

## 2016-06-01 LAB — HGB A1C W/O EAG: HEMOGLOBIN A1C: 11.9 % — AB (ref 4.8–5.6)

## 2016-06-02 NOTE — Telephone Encounter (Signed)
LVM to return call.

## 2016-06-28 ENCOUNTER — Ambulatory Visit: Payer: Self-pay | Admitting: Internal Medicine

## 2016-07-08 ENCOUNTER — Other Ambulatory Visit: Payer: Self-pay | Admitting: Internal Medicine

## 2016-07-08 DIAGNOSIS — I10 Essential (primary) hypertension: Secondary | ICD-10-CM

## 2016-07-08 DIAGNOSIS — E1165 Type 2 diabetes mellitus with hyperglycemia: Secondary | ICD-10-CM

## 2016-07-08 DIAGNOSIS — E785 Hyperlipidemia, unspecified: Secondary | ICD-10-CM

## 2016-07-15 ENCOUNTER — Telehealth: Payer: Self-pay | Admitting: Internal Medicine

## 2016-07-15 NOTE — Telephone Encounter (Signed)
Patient called requesting refills for metFORMIN (GLUCOPHAGE) 1000 MG tablet Please send to [facility]

## 2016-07-15 NOTE — Telephone Encounter (Signed)
Medication was refilled in December with 11 refills on it. Please call patient and inform.

## 2016-07-23 ENCOUNTER — Ambulatory Visit: Payer: Self-pay | Admitting: Internal Medicine

## 2016-07-23 NOTE — Telephone Encounter (Signed)
Medication was picked up yesterday.

## 2016-08-30 ENCOUNTER — Encounter: Payer: Self-pay | Admitting: Internal Medicine

## 2016-08-30 ENCOUNTER — Ambulatory Visit (INDEPENDENT_AMBULATORY_CARE_PROVIDER_SITE_OTHER): Payer: Self-pay | Admitting: Internal Medicine

## 2016-08-30 ENCOUNTER — Ambulatory Visit: Payer: Self-pay | Admitting: Internal Medicine

## 2016-08-30 VITALS — BP 122/82 | HR 78 | Resp 12 | Ht 65.0 in | Wt 199.0 lb

## 2016-08-30 DIAGNOSIS — E1165 Type 2 diabetes mellitus with hyperglycemia: Secondary | ICD-10-CM

## 2016-08-30 DIAGNOSIS — E785 Hyperlipidemia, unspecified: Secondary | ICD-10-CM

## 2016-08-30 DIAGNOSIS — E11319 Type 2 diabetes mellitus with unspecified diabetic retinopathy without macular edema: Secondary | ICD-10-CM

## 2016-08-30 DIAGNOSIS — I10 Essential (primary) hypertension: Secondary | ICD-10-CM

## 2016-08-30 LAB — GLUCOSE, POCT (MANUAL RESULT ENTRY): POC Glucose: 164 mg/dl — AB (ref 70–99)

## 2016-08-30 MED ORDER — SITAGLIPTIN PHOS-METFORMIN HCL 50-1000 MG PO TABS: 1.0000 | ORAL_TABLET | Freq: Two times a day (BID) | ORAL | 11 refills | Status: DC

## 2016-08-30 NOTE — Patient Instructions (Signed)
Stop Metformin once Janumet is approved. Please call clinic if you do not get approved for Janumet.

## 2016-08-30 NOTE — Progress Notes (Signed)
Subjective:    Patient ID: Thomas Ryan, male    DOB: 1961/01/28, 56 y.o.   MRN: [id number]  HPI   1.  DM:  A1C in March was 11.9%, up from 10.5% in November.  States taking Glipizide 10 mg and Metformin 1000 mg twice daily with meals. Has started walking daily about 2 months ago--started in April. Son in Social worker, who is a Engineer, civil (consulting), describes him eating a lot of carbohydrates:  Pan dulce, tortillas, crackers.  Big meals with lots of carbs on Saturday and Sunday.   He does not have an orange card currently, so would be hard to get him on other diabetic medications. Janumet is well over $400 per month.  Had eye check 5-6 months ago and noted to have diabetic changes.  Was to be seen by Ophthalmologist, but that has never happened.  Not taking his ASA 81 mg daily.  States he never received prescription for this.   Immunization History  Administered Date(s) Administered  . Influenza Split 04/18/2012  . Influenza-Unspecified 02/06/2015  . Pneumococcal Polysaccharide-23 08/01/2015  . Tdap 03/23/2011    2.  Dyslipidemia:  Taking Lovastatin he states every day as well, despite the fact his cholesterol panel is worsening.   Later, he admits to forgetting his medication frequently.  States really only misses this medication.  3.  Essential Hypertension with microalbuminuria:  States taking Lisinopril/HCTZ.  BP remains controlled.  Current Meds  Medication Sig  . glipiZIDE (GLUCOTROL) 10 MG tablet TAKE ONE TABLET BY MOUTH TWICE DAILY BEFORE MEAL(S) STOP  GLIPIZIDE  5  MG  . lisinopril-hydrochlorothiazide (PRINZIDE,ZESTORETIC) 20-12.5 MG tablet TAKE ONE TABLET BY MOUTH IN THE MORNING STOP  LISINOPRIL-HCTZ  10/12.5  . lovastatin (MEVACOR) 20 MG tablet TAKE ONE TABLET BY MOUTH ONCE DAILY AT BEDTIME  . metFORMIN (GLUCOPHAGE) 1000 MG tablet Take 1 tablet (1,000 mg total) by mouth 2 (two) times daily with a meal.    No Known Allergies  Review of Systems     Objective:   Physical Exam    NAD Lungs: CTA CV:  RRR with normal S1 and S2, No murmur or rub.  Radial, DP and PT pulses normodynamic and equal Abd:  S, + BS, No HSM or mass, + BS  Diabetic Foot Exam - Simple   Simple Foot Form Diabetic Foot exam was performed with the following findings:  Yes 08/30/2016 10:00 AM  Visual Inspection No deformities, no ulcerations, no other skin breakdown bilaterally:  Yes Sensation Testing Intact to touch and monofilament testing bilaterally:  Yes Pulse Check Posterior Tibialis and Dorsalis pulse intact bilaterally:  Yes Comments Scarring of anterior tibial area with trace edema          Assessment & Plan:  1.  DM:  Has not been controlled over the past 2-3 years and more.  Continues to eat poorly by history.  Not clear he is really taking meds as consistently as he states. May no longer meet criteria to remain in clinic--family states they will reapply for orange card next month.  State he made more money last month as he was taking required overtime, which will stop shortly. Will switch him to Janumet and see if any difference. A1C today to see if walking has improved. Encouraged to work on diet again. Will look into Ophthalmology referral and why has not occurred from Optometry--need to have orange card first or will refer to [facility]  2.  Dyslipidemia:  Not compliant with Lovastatin.  He states  he will work on this.  Will not check FLP today as expect to not be at goal without medication.  3.  Essential Hypertension and microalbuminuria:  BP fine.  States taking medication.  Addendum:  Called pharmacy:  Patient just filled glipizide 4/20 last with last prior refill in January. Lisinopril/HCTZ filled 3 times since April 3. Lovastatin filled 4/20 after no fill since 02/23/16 Metformin not filled since October 2017

## 2016-08-31 LAB — HGB A1C W/O EAG: Hgb A1c MFr Bld: 12.3 % — ABNORMAL HIGH (ref 4.8–5.6)

## 2016-09-12 ENCOUNTER — Other Ambulatory Visit: Payer: Self-pay | Admitting: Internal Medicine

## 2016-09-12 DIAGNOSIS — E1165 Type 2 diabetes mellitus with hyperglycemia: Secondary | ICD-10-CM

## 2016-12-17 ENCOUNTER — Other Ambulatory Visit: Payer: Self-pay | Admitting: Internal Medicine

## 2016-12-17 DIAGNOSIS — E785 Hyperlipidemia, unspecified: Secondary | ICD-10-CM

## 2016-12-17 DIAGNOSIS — I10 Essential (primary) hypertension: Secondary | ICD-10-CM

## 2017-01-13 ENCOUNTER — Other Ambulatory Visit: Payer: Self-pay | Admitting: Internal Medicine

## 2017-01-13 DIAGNOSIS — E1165 Type 2 diabetes mellitus with hyperglycemia: Secondary | ICD-10-CM

## 2017-03-24 ENCOUNTER — Other Ambulatory Visit: Payer: Self-pay | Admitting: Internal Medicine

## 2017-03-24 DIAGNOSIS — E1165 Type 2 diabetes mellitus with hyperglycemia: Secondary | ICD-10-CM

## 2017-04-16 ENCOUNTER — Other Ambulatory Visit: Payer: Self-pay | Admitting: Internal Medicine

## 2017-04-16 DIAGNOSIS — E1165 Type 2 diabetes mellitus with hyperglycemia: Secondary | ICD-10-CM

## 2017-05-15 ENCOUNTER — Other Ambulatory Visit: Payer: Self-pay | Admitting: Internal Medicine

## 2017-05-15 DIAGNOSIS — I10 Essential (primary) hypertension: Secondary | ICD-10-CM

## 2017-05-15 DIAGNOSIS — E1165 Type 2 diabetes mellitus with hyperglycemia: Secondary | ICD-10-CM

## 2017-06-07 ENCOUNTER — Encounter: Payer: Self-pay | Admitting: Internal Medicine

## 2017-06-07 ENCOUNTER — Ambulatory Visit: Payer: Self-pay | Admitting: Internal Medicine

## 2017-06-07 VITALS — BP 120/80 | HR 84 | Resp 12 | Ht 64.5 in | Wt 194.5 lb

## 2017-06-07 DIAGNOSIS — E785 Hyperlipidemia, unspecified: Secondary | ICD-10-CM

## 2017-06-07 DIAGNOSIS — E041 Nontoxic single thyroid nodule: Secondary | ICD-10-CM

## 2017-06-07 DIAGNOSIS — B070 Plantar wart: Secondary | ICD-10-CM

## 2017-06-07 DIAGNOSIS — E1165 Type 2 diabetes mellitus with hyperglycemia: Secondary | ICD-10-CM

## 2017-06-07 DIAGNOSIS — E11319 Type 2 diabetes mellitus with unspecified diabetic retinopathy without macular edema: Secondary | ICD-10-CM | POA: Insufficient documentation

## 2017-06-07 DIAGNOSIS — Z79899 Other long term (current) drug therapy: Secondary | ICD-10-CM

## 2017-06-07 MED ORDER — GLIPIZIDE 10 MG PO TABS: ORAL_TABLET | ORAL | 3 refills | Status: DC
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
## 2017-06-08 LAB — COMPREHENSIVE METABOLIC PANEL
ALT: 35 IU/L (ref 0–44)
AST: 26 IU/L (ref 0–40)
Albumin/Globulin Ratio: 1.7 (ref 1.2–2.2)
Albumin: 4.4 g/dL (ref 3.5–5.5)
Alkaline Phosphatase: 116 IU/L (ref 39–117)
BUN/Creatinine Ratio: 15 (ref 9–20)
BUN: 11 mg/dL (ref 6–24)
Bilirubin Total: 0.6 mg/dL (ref 0.0–1.2)
CALCIUM: 9.8 mg/dL (ref 8.7–10.2)
CO2: 26 mmol/L (ref 20–29)
Chloride: 98 mmol/L (ref 96–106)
Creatinine, Ser: 0.74 mg/dL — ABNORMAL LOW (ref 0.76–1.27)
GFR, EST AFRICAN AMERICAN: 119 mL/min/{1.73_m2} (ref >59)
GFR, EST NON AFRICAN AMERICAN: 103 mL/min/{1.73_m2} (ref >59)
GLUCOSE: 291 mg/dL — AB (ref 65–99)
Globulin, Total: 2.6 g/dL (ref 1.5–4.5)
Potassium: 4.9 mmol/L (ref 3.5–5.2)
Sodium: 140 mmol/L (ref 134–144)
TOTAL PROTEIN: 7 g/dL (ref 6.0–8.5)

## 2017-06-08 LAB — LIPID PANEL W/O CHOL/HDL RATIO
CHOLESTEROL TOTAL: 176 mg/dL (ref 100–199)
HDL: 44 mg/dL (ref >39)
LDL CALC: 95 mg/dL (ref 0–99)
Triglycerides: 185 mg/dL — ABNORMAL HIGH (ref 0–149)
VLDL CHOLESTEROL CAL: 37 mg/dL (ref 5–40)

## 2017-06-08 LAB — CBC WITH DIFFERENTIAL/PLATELET
BASOS ABS: 0.1 10*3/uL (ref 0.0–0.2)
BASOS: 1 %
EOS (ABSOLUTE): 0.8 10*3/uL — ABNORMAL HIGH (ref 0.0–0.4)
Eos: 9 %
Hematocrit: 48.2 % (ref 37.5–51.0)
Hemoglobin: 16.2 g/dL (ref 13.0–17.7)
IMMATURE GRANS (ABS): 0 10*3/uL (ref 0.0–0.1)
IMMATURE GRANULOCYTES: 0 %
LYMPHS: 30 %
Lymphocytes Absolute: 2.5 10*3/uL (ref 0.7–3.1)
MCH: 28.2 pg (ref 26.6–33.0)
MCHC: 33.6 g/dL (ref 31.5–35.7)
MCV: 84 fL (ref 79–97)
Monocytes Absolute: 0.5 10*3/uL (ref 0.1–0.9)
Monocytes: 6 %
NEUTROS ABS: 4.5 10*3/uL (ref 1.4–7.0)
Neutrophils: 54 %
PLATELETS: 206 10*3/uL (ref 150–379)
RBC: 5.74 x10E6/uL (ref 4.14–5.80)
RDW: 14.1 % (ref 12.3–15.4)
WBC: 8.4 10*3/uL (ref 3.4–10.8)

## 2017-06-08 LAB — TSH: TSH: 1.39 u[IU]/mL (ref 0.450–4.500)

## 2017-06-08 LAB — HGB A1C W/O EAG: HEMOGLOBIN A1C: 13.7 % — AB (ref 4.8–5.6)

## 2017-06-08 LAB — MICROALBUMIN / CREATININE URINE RATIO
CREATININE, UR: 109.4 mg/dL
MICROALB/CREAT RATIO: 9 mg/g{creat} (ref 0.0–30.0)
MICROALBUM., U, RANDOM: 9.9 ug/mL

## 2017-09-21 ENCOUNTER — Other Ambulatory Visit: Payer: Self-pay | Admitting: Internal Medicine

## 2017-09-21 DIAGNOSIS — E785 Hyperlipidemia, unspecified: Secondary | ICD-10-CM

## 2017-10-05 ENCOUNTER — Ambulatory Visit: Payer: Self-pay | Admitting: Internal Medicine

## 2017-10-05 ENCOUNTER — Encounter: Payer: Self-pay | Admitting: Internal Medicine

## 2017-10-05 VITALS — BP 122/78 | HR 80 | Resp 12 | Ht 64.5 in | Wt 195.0 lb

## 2017-10-05 DIAGNOSIS — E1165 Type 2 diabetes mellitus with hyperglycemia: Secondary | ICD-10-CM

## 2017-10-05 DIAGNOSIS — E785 Hyperlipidemia, unspecified: Secondary | ICD-10-CM

## 2017-10-05 DIAGNOSIS — Z1159 Encounter for screening for other viral diseases: Secondary | ICD-10-CM

## 2017-10-05 DIAGNOSIS — I10 Essential (primary) hypertension: Secondary | ICD-10-CM

## 2017-10-05 LAB — GLUCOSE, POCT (MANUAL RESULT ENTRY): POC Glucose: 318 mg/dl — AB (ref 70–99)

## 2017-10-05 MED ORDER — FLUCONAZOLE 150 MG PO TABS: 150.0000 mg | ORAL_TABLET | Freq: Once | ORAL | 0 refills | Status: AC

## 2017-10-05 NOTE — Patient Instructions (Addendum)
Diabetes mellitus y nutricin Diabetes Mellitus and Nutrition Si sufre de diabetes (diabetes mellitus), es muy importante tener hbitos alimenticios saludables debido a que sus niveles de azcar en la sangre (glucosa) se ven afectados en gran medida por lo que come y bebe. [place] alimentos saludables en las cantidades adecuadas, aproximadamente a la misma hora todos los das, lo ayudar a:  Controlar la glucemia.  Disminuir el riesgo de sufrir una enfermedad cardaca.  Mejorar la presin arterial.  Alcanzar o mantener un peso saludable.  Todas las personas que sufren de diabetes son diferentes y cada una tiene necesidades diferentes en cuanto a un plan de alimentacin. El mdico puede recomendarle que trabaje con un especialista en dietas y nutricin (nutricionista) para elaborar el mejor plan para usted. Su plan de alimentacin puede variar segn factores como:  Las caloras que necesita.  Los medicamentos que toma.  Su peso.  Sus niveles de glucemia, presin arterial y colesterol.  Su nivel de actividad.  Otras afecciones que tenga, como enfermedades cardacas o renales.  Cmo me afectan los carbohidratos? Los carbohidratos afectan el nivel de glucemia ms que cualquier otro tipo de alimento. La ingesta de carbohidratos naturalmente aumenta la cantidad glucosa en la sangre. El recuento de carbohidratos es un mtodo destinado a llevar un registro de la cantidad de carbohidratos que se ingieren. El recuento de carbohidratos es importante para mantener la glucemia a un nivel saludable, en especial si utiliza insulina o toma determinados medicamentos por va oral para la diabetes. Es importante saber la cantidad de carbohidratos que se pueden ingerir en cada comida sin correr ningn riesgo. Esto es diferente en cada persona. El nutricionista puede ayudarlo a calcular la cantidad de carbohidratos que debe ingerir en cada comida y colacin. Los alimentos que contienen carbohidratos  incluyen:  Pan, cereal, arroz, pasta y galletas.  Papas y maz.  Guisantes, frijoles y lentejas.  Leche y yogur.  Frutas y jugo.  Postres, como pasteles, galletitas, helado y caramelos.  Cmo me afecta el alcohol? El alcohol puede provocar disminuciones sbitas de la glucemia (hipoglucemia), en especial si utiliza insulina o toma determinados medicamentos por va oral para la diabetes. La hipoglucemia es una afeccin potencialmente mortal. Los sntomas de la hipoglucemia (somnolencia, mareos y confusin) son similares a los sntomas de haber consumido demasiado alcohol. Si el mdico afirma que el alcohol es seguro para usted, siga estas pautas:  Limite el consumo de alcohol a no ms de 1 medida por da si es mujer y no est embarazada, y a 2 medidas si es hombre. Una medida equivale a 12oz (355ml) de cerveza, 5oz (148ml) de vino o 1oz (44ml) de bebidas de alta graduacin alcohlica.  No beba con el estmago vaco.  Mantngase hidratado con agua, gaseosas dietticas o t helado sin azcar.  Tenga en cuenta que las gaseosas comunes, los jugos y otros refrescos pueden contener mucha azcar y se deben contar como carbohidratos.  Consejos para seguir este plan Leer las etiquetas de los alimentos  Comience por controlar el tamao de la porcin en la etiqueta. La cantidad de caloras, carbohidratos, grasas y otros nutrientes mencionados en la etiqueta se basan en una porcin del alimento. Muchos alimentos contienen ms de una porcin por envase.  Verifique la cantidad total de gramos (g) de carbohidratos totales en una porcin. Puede calcular la cantidad de porciones de carbohidratos al dividir el total de carbohidratos por 15. Por ejemplo, si un alimento posee un total de 30g de carbohidratos, equivale a 2 porciones   de carbohidratos.  Verifique la cantidad de gramos (g) de grasas saturadas y grasas trans en una porcin. Escoja alimentos que no contengan grasa o que tengan un bajo  contenido.  Controle la cantidad de miligramos (mg) de sodio en una porcin. [place] [employer] de las personas deben limitar la ingesta de sodio total a menos de 2321m por dTraining and development officer  Siempre consulte la informacin nutricional de los alimentos etiquetados como "con bajo contenido de grasa" o "sin grasa". Estos alimentos pueden ser ms altos en azcar agregada o en carbohidratos refinados y deben evitarse.  Hable con el nutricionista para identificar sus objetivos diarios en cuanto a los nutrientes mencionados en la etiqueta. De compras  Evite comprar alimentos procesados, enlatados o prehechos. Estos alimentos tienden a tTEFL teachercantidad de gBethel sodio y azcar agregada.  Compre en la zona exterior de la tienda de comestibles. Esta incluye frutas y vNorthrop Grumman granos a granel, carnes frescas y productos lcteos frescos. Coccin  Utilice mtodos de coccin a baja temperatura, como hornear, en lugar de mtodos de coccin a alta temperatura, como frer en abundante aceite.  Cocine con aceites saludables, como el aceite de oAlva canola o gDale[place]  Evite cocinar con manteca, crema o carnes con alto contenido de grasa. Planificacin de las comidas  CInternational Papercomidas y las colaciones de forma regular, preferentemente a la misma hora todos lHaines City Evite pasar largos perodos de tiempo sin comer.  Consuma alimentos ricos en fibra, como frutas frescas, verduras, frijoles y cereales integrales. Consulte al nutricionista sobre cuntas porciones de carbohidratos puede consumir en cada comida.  Consuma entre 4 y 6 onzas de protenas magras por da, como carnes mArgyle pollo, pescado, hFirst Data Corporationo tofu. 1 onza equivale a 1 onza de carne, pollo o pescado, 1 huevo, o 1/4 taza de tofu.  Coma algunos alimentos por da que contengan grasas saludables, como aguacates, frutos secos, semillas y pescado. Estilo de vida   Controle su nivel de glucemia con regularidad.  Haga ejercicio al menos 366mutos,  5das o ms por semana, o como se lo haya indicado el mdico.  Tome los meTenneco Ince lo haya indicado el mdico.  No consuma ningn producto que contenga nicotina o tabaco, como cigarrillos y ciPsychologist, sport and exerciseSi necesita ayuda para dejar de fumar, consulte al mdHess Corporationon un asesor o instructor en diabetes para identificar estrategias para controlar el estrs y cualquier desafo emocional y social. Cules son algunas de las preguntas que puedo hacerle a mi mdico?  Es necesario que me rena con unRadio broadcast assistantn diabetes?  Es necesario que me rena con un nutricionista?  A qu nmero puedo llamar si tengo preguntas?  Cules son los mejores momentos para controlar la glucemia? Dnde encontrar ms informacin:  Asociacin Americana de la Diabetes (American Diabetes Association): diabetes.org/food-and-fitness/food  Academia de Nutricin y DiInformation systems managerAcademy of Nutrition and Dietetics): wwPokerClues.dkInNew Riegeliabetes y laWatertown Town ReWater quality scientistNMedstar Saint Mary'S Hospitalf Diabetes and Digestive and Kidney Diseases) (InGulkanaNIH): wwContactWire.beesumen  Un plan de alimentacin saludable lo ayudar a coAeronautical engineerlucemia y maTheatre managern estilo de vida saludable.  Trabajar con un especialista en dietas y nutricin (nutricionista) puede ayudarlo a elInsurance claims handlere alimentacin para usted.  Tenga en cuenta que los carbohidratos y el alcohol tienen efectos inmediatos en sus niveles de glucemia. Es importante contar los carbohidratos y consumir alcohol con prudencia. Esta informacin no tiene coMarine scientistl consejo del  mdico. Asegrese de hacerle al mdico cualquier pregunta que tenga. Document Released: 06/15/2007 Document Revised: 06/28/2016 Document Reviewed:  06/28/2016 Elsevier Interactive Patient Education  2018 [employer]   G[place]mooreome un vaso de agua antes de cada comida Tome un minimo de 6 a 8 vasos de agua diarios Coma tres veces al dia Coma Neomia Dearuna proteina y Neomia Dearuna grasa saludable con comida.  (huevos, pescado, pollo, pavo, y limite carnes rojas Coma 5 porciones diarias de legumbres.  Mezcle los colores Coma 2 porciones diarias de frutas con cascara cuando sea comestible Use platos pequeos Suelte su tenedor o cuchara despues de cada mordida hata que se mastique y se trague Come en la mesa con amigos o familiares por lo menos una vez al dia [place] la televisin y aparatos electrnicos durante la comida  Su objetivo debe ser perder una libra por semana  Estudios recientes indican que las personas quienes consumen todos de sus calorias durante 12 horas se bajan de pesocon Mas eficiencia.  Por ejemplo, si Usted come su primera comida a las 7:00 a.m., su comida final del dia se debe completar antes de las 7:00 p.m.  Hold Lovastatin on day you take the Fluconazole

## 2017-10-05 NOTE — Progress Notes (Signed)
   Subjective:    Patient ID: Thomas Ryan, male    DOB: [birth year], 57 y.o.   MRN: [id number]  HPI   1.  DM:  Did restart Glipizide twice daily.  States his sugars are running 140 - 200.  Not checking sugars regularly.   Has not really changed his diet, particularly on the weekend when the family has a big dinner with lots of carbs.  Daughter and son in law state he does pack good lunches, walks daily and does well in general during the week, though does not check sugar very often.  It is the weekend when he has the difficulty with poor diet.     2.  Essential Hypertension:  Taking meds regularly.  3.  Tip of penis with itching. A bit red.  He is not circumcised.   Current Meds  Medication Sig  . glipiZIDE (GLUCOTROL) 10 MG tablet 1 tab by mouth twice daily with meals  . lisinopril-hydrochlorothiazide (PRINZIDE,ZESTORETIC) 20-12.5 MG tablet TAKE 1 TABLET BY MOUTH IN THE MORNING STOP  LISINOPRIL/HCTZ  10/12.5  . lovastatin (MEVACOR) 20 MG tablet TAKE 1 TABLET BY MOUTH AT BEDTIME  . metFORMIN (GLUCOPHAGE) 1000 MG tablet TAKE ONE TABLET BY MOUTH TWICE DAILY WITH MEALS    No Known Allergies       Review of Systems     Objective:   Physical Exam NAD Lungs:  CTA CV: RRR without murmur or rub, Radial and DP pulse normal and equal Abd:  S, NT, No HSM or mass, + BS GU:  Foreskin easily retracted and patient with clear wet glans with erythema   Diabetic Foot Exam - Simple   Simple Foot Form Diabetic Foot exam was performed with the following findings:  Yes 10/05/2017 11:08 AM  Visual Inspection No deformities, no ulcerations, no other skin breakdown bilaterally:  Yes Sensation Testing Intact to touch and monofilament testing bilaterally:  Yes Pulse Check Posterior Tibialis and Dorsalis pulse intact bilaterally:  Yes Comments         Assessment & Plan:  1.  DM:  Recheck A1C today.  If still not at goal, will switch to Janumet and continue Glipizide. Referral to  Diabetic Nutrition. He is to work on dietary goals on the Clear [employer] Communicationsweekends--fewer carbs in  Particular.  2.  HM:  Hep C Ig today.  3.  Hypertension:  Controlled  4.  Dyslipidemia:  Was close to goal at last check.  Nonfasting today.  Will recheck in 3 months before next followup

## 2017-10-06 LAB — HEPATITIS C ANTIBODY

## 2017-10-06 LAB — HGB A1C W/O EAG: Hgb A1c MFr Bld: 13.4 % — ABNORMAL HIGH (ref 4.8–5.6)

## 2017-10-06 MED ORDER — INSULIN GLARGINE 100 UNIT/ML SOLOSTAR PEN
PEN_INJECTOR | SUBCUTANEOUS | 11 refills | Status: DC
Start: 2017-10-06 — End: 2018-01-09

## 2017-10-06 NOTE — Addendum Note (Signed)
Addended by: Marcene DuosMULBERRY, Jared Whorley M on: 10/06/2017 09:34 PM   Modules accepted: Orders

## 2017-11-23 ENCOUNTER — Other Ambulatory Visit: Payer: Self-pay | Admitting: Internal Medicine

## 2017-11-23 DIAGNOSIS — I10 Essential (primary) hypertension: Secondary | ICD-10-CM

## 2017-12-01 ENCOUNTER — Encounter (HOSPITAL_COMMUNITY): Payer: Self-pay

## 2017-12-01 ENCOUNTER — Emergency Department (HOSPITAL_COMMUNITY)
Admission: EM | Admit: 2017-12-01 | Discharge: 2017-12-01 | Disposition: A | Discharge status: Home / Self Care | Payer: Self-pay | Attending: Emergency Medicine | Admitting: Emergency Medicine

## 2017-12-01 ENCOUNTER — Other Ambulatory Visit: Payer: Self-pay

## 2017-12-01 DIAGNOSIS — I10 Essential (primary) hypertension: Secondary | ICD-10-CM | POA: Insufficient documentation

## 2017-12-01 DIAGNOSIS — M5432 Sciatica, left side: Secondary | ICD-10-CM | POA: Insufficient documentation

## 2017-12-01 DIAGNOSIS — Z7982 Long term (current) use of aspirin: Secondary | ICD-10-CM | POA: Insufficient documentation

## 2017-12-01 DIAGNOSIS — Z79899 Other long term (current) drug therapy: Secondary | ICD-10-CM | POA: Insufficient documentation

## 2017-12-01 DIAGNOSIS — Z794 Long term (current) use of insulin: Secondary | ICD-10-CM | POA: Insufficient documentation

## 2017-12-01 DIAGNOSIS — E11319 Type 2 diabetes mellitus with unspecified diabetic retinopathy without macular edema: Secondary | ICD-10-CM | POA: Insufficient documentation

## 2017-12-01 LAB — URINALYSIS, ROUTINE W REFLEX MICROSCOPIC
BACTERIA UA: NONE SEEN
BILIRUBIN URINE: NEGATIVE
Glucose, UA: >=500 mg/dL — AB
HGB URINE DIPSTICK: NEGATIVE
KETONES UR: NEGATIVE mg/dL
LEUKOCYTES UA: NEGATIVE
NITRITE: NEGATIVE
Protein, ur: NEGATIVE mg/dL
SPECIFIC GRAVITY, URINE: 1.03 (ref 1.005–1.030)
pH: 5 (ref 5.0–8.0)

## 2017-12-01 MED ORDER — METHOCARBAMOL 500 MG PO TABS: 500.0000 mg | ORAL_TABLET | Freq: Two times a day (BID) | ORAL | 0 refills | Status: DC

## 2017-12-01 MED ORDER — HYDROCODONE-ACETAMINOPHEN 5-325 MG PO TABS
1.0000 | ORAL_TABLET | Freq: Once | ORAL | Status: AC
  Administered 2017-12-01: 1 via ORAL
  Filled 2017-12-01: qty 1

## 2017-12-01 MED ORDER — OXYCODONE-ACETAMINOPHEN 5-325 MG PO TABS: 1.0000 | ORAL_TABLET | Freq: Three times a day (TID) | ORAL | 0 refills | Status: DC | PRN

## 2017-12-01 MED ORDER — LIDOCAINE 5 % EX PTCH
1.0000 | MEDICATED_PATCH | CUTANEOUS | Status: DC
  Administered 2017-12-01: 1 via TRANSDERMAL
  Filled 2017-12-01: qty 1

## 2017-12-01 NOTE — ED Triage Notes (Signed)
Pt endorses right lower back pain radiating down the back of the right leg. No urinary sx. Has been taking tylenol with relief until last night. VSS.

## 2017-12-01 NOTE — Discharge Instructions (Signed)
Return to ED for worsening symptoms, trouble walking, numbness in your groin area, losing control of your bowels or bladder, injuries or falls.
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
## 2017-12-01 NOTE — ED Notes (Signed)
Patient verbalizes understanding of discharge instructions. Opportunity for questioning and answers were provided. Armband removed by staff, pt discharged from ED.  

## 2017-12-02 LAB — URINE CULTURE: Culture: NO GROWTH

## 2018-01-05 ENCOUNTER — Other Ambulatory Visit: Payer: Self-pay

## 2018-01-05 DIAGNOSIS — E1165 Type 2 diabetes mellitus with hyperglycemia: Secondary | ICD-10-CM

## 2018-01-05 DIAGNOSIS — Z79899 Other long term (current) drug therapy: Secondary | ICD-10-CM

## 2018-01-05 DIAGNOSIS — E785 Hyperlipidemia, unspecified: Secondary | ICD-10-CM

## 2018-01-06 LAB — COMPREHENSIVE METABOLIC PANEL
ALBUMIN: 4.1 g/dL (ref 3.5–5.5)
ALK PHOS: 102 IU/L (ref 39–117)
ALT: 27 IU/L (ref 0–44)
AST: 22 IU/L (ref 0–40)
Albumin/Globulin Ratio: 1.6 (ref 1.2–2.2)
BILIRUBIN TOTAL: 0.5 mg/dL (ref 0.0–1.2)
BUN / CREAT RATIO: 21 — AB (ref 9–20)
BUN: 13 mg/dL (ref 6–24)
CHLORIDE: 99 mmol/L (ref 96–106)
CO2: 24 mmol/L (ref 20–29)
Calcium: 9.6 mg/dL (ref 8.7–10.2)
Creatinine, Ser: 0.63 mg/dL — ABNORMAL LOW (ref 0.76–1.27)
GFR calc Af Amer: 126 mL/min/{1.73_m2} (ref >59)
GFR calc non Af Amer: 109 mL/min/{1.73_m2} (ref >59)
GLUCOSE: 207 mg/dL — AB (ref 65–99)
Globulin, Total: 2.5 g/dL (ref 1.5–4.5)
POTASSIUM: 4.8 mmol/L (ref 3.5–5.2)
SODIUM: 141 mmol/L (ref 134–144)
Total Protein: 6.6 g/dL (ref 6.0–8.5)

## 2018-01-06 LAB — HGB A1C W/O EAG: Hgb A1c MFr Bld: 11.8 % — ABNORMAL HIGH (ref 4.8–5.6)

## 2018-01-06 LAB — LIPID PANEL W/O CHOL/HDL RATIO
Cholesterol, Total: 155 mg/dL (ref 100–199)
HDL: 39 mg/dL — AB (ref >39)
LDL Calculated: 69 mg/dL (ref 0–99)
TRIGLYCERIDES: 233 mg/dL — AB (ref 0–149)
VLDL Cholesterol Cal: 47 mg/dL — ABNORMAL HIGH (ref 5–40)

## 2018-01-09 ENCOUNTER — Ambulatory Visit: Payer: Self-pay | Admitting: Internal Medicine

## 2018-01-09 ENCOUNTER — Encounter: Payer: Self-pay | Admitting: Internal Medicine

## 2018-01-09 VITALS — BP 122/82 | HR 84 | Resp 12 | Ht 64.5 in | Wt 190.0 lb

## 2018-01-09 DIAGNOSIS — E785 Hyperlipidemia, unspecified: Secondary | ICD-10-CM

## 2018-01-09 DIAGNOSIS — M5417 Radiculopathy, lumbosacral region: Secondary | ICD-10-CM

## 2018-01-09 DIAGNOSIS — I10 Essential (primary) hypertension: Secondary | ICD-10-CM

## 2018-01-09 DIAGNOSIS — E1165 Type 2 diabetes mellitus with hyperglycemia: Secondary | ICD-10-CM

## 2018-01-09 LAB — GLUCOSE, POCT (MANUAL RESULT ENTRY): POC Glucose: 289 mg/dl — AB (ref 70–99)

## 2018-01-09 MED ORDER — INSULIN GLARGINE 100 UNIT/ML SOLOSTAR PEN: PEN_INJECTOR | SUBCUTANEOUS | 11 refills | Status: DC

## 2018-01-09 MED ORDER — MELOXICAM 15 MG PO TABS: ORAL_TABLET | ORAL | 1 refills | Status: DC

## 2018-01-09 NOTE — Progress Notes (Signed)
   Subjective:    Patient ID: Thomas Ryan, male    DOB: 11-11-1960, 57 y.o.   MRN: [id number]  HPI   1.  DM:  Sugars have been running high.  Generally running above 180 and above 200 most of time.   He did not pick up the insulin as instructed.  States he never received the voicemail sent out in [place].   Son in law states the phone calls should probably come to him. Not clear if he ever received call for nutrition through [facility].  His orange card is again not current, so suspect did not go through.   Has not had flu vaccine this fall yet.  2.  Hyperlipidemia:  Taking Lovastatin 20 mg daily.  No problems.  FLP recently at goal with LDL and total, but not HDL and trigs. Does walk regularly.  Lipid Panel     Component Value Date/Time   CHOL 155 01/05/2018 0846   TRIG 233 (H) 01/05/2018 0846   HDL 39 (L) 01/05/2018 0846   CHOLHDL 4.2 02/06/2016 0915   CHOLHDL 5.1 04/18/2012 1109   VLDL 71 (H) 04/18/2012 1109   LDLCALC 69 01/05/2018 0846   3.  Hypertension:  Controlled.  4.  Left low back pain with radiculopathy:   No recent history of injury. Was at work when started feeling the pain.  Points to posterior thigh as area where feels radicular pain. He was using a machine he needs to step down on a pedal to start it the machine and this perhaps set it off. Was seen in ED for this 12/01/17.  He was given [facility] and Oxycodone for the pain.  No corticosteroids for the pain. Pain is better now, but significantly aggravated by working 4-5 hours utilizing the machine that sets this off.  No urinary or fecal incontinence      Review of Systems     Objective:   Physical Exam NAD HEENT:  PERRL, EOMI, TMs pearly gray, throat without injection Neck:  Supple, No adenopathy, no thyromegaly Chest:  CTA CV:  RRR without murmur or rub.  Radial and DP pulses normal and equal Abd:  S, NT, No HSM or mass, + BS LE:  No edema Back:  NT over spinous processes entire spine.   Paraspinous musculature NT. Neuro:  LE:  Motor 5/5, DTRs 2+/4 throughout.  Sensory grossly normal to light touch.  Gait normal       Assessment & Plan:  1.  DM:  Continue oral meds.  Does not need orange card to apply to [facility] for Lantus.  Again, reordered start of Lantus 10 units daily.  2.  Dyslipidemia:  To continue to work on diet and physical activity.    3.  Left low back pain with S2 radicular pain.  Meloxicam 15 mg daily.  Not interested in PT due to work.    4.  Hypertension:  Controlled.    5.  HM:  Free flu vaccine script at [facility] written For wife as well.

## 2018-01-18 ENCOUNTER — Other Ambulatory Visit: Payer: Self-pay | Admitting: Internal Medicine

## 2018-01-18 DIAGNOSIS — I10 Essential (primary) hypertension: Secondary | ICD-10-CM

## 2018-01-18 DIAGNOSIS — E1165 Type 2 diabetes mellitus with hyperglycemia: Secondary | ICD-10-CM

## 2018-01-18 DIAGNOSIS — E785 Hyperlipidemia, unspecified: Secondary | ICD-10-CM

## 2018-02-21 ENCOUNTER — Other Ambulatory Visit: Payer: Self-pay | Admitting: Internal Medicine

## 2018-02-21 DIAGNOSIS — E1165 Type 2 diabetes mellitus with hyperglycemia: Secondary | ICD-10-CM

## 2018-03-10 ENCOUNTER — Encounter: Payer: Self-pay | Admitting: Internal Medicine

## 2018-03-10 ENCOUNTER — Ambulatory Visit: Payer: Self-pay | Admitting: Internal Medicine

## 2018-03-10 VITALS — BP 122/82 | HR 80 | Resp 12 | Ht 64.5 in | Wt 190.0 lb

## 2018-03-10 DIAGNOSIS — E1165 Type 2 diabetes mellitus with hyperglycemia: Secondary | ICD-10-CM

## 2018-03-10 DIAGNOSIS — I10 Essential (primary) hypertension: Secondary | ICD-10-CM

## 2018-03-10 DIAGNOSIS — M5417 Radiculopathy, lumbosacral region: Secondary | ICD-10-CM

## 2018-03-10 LAB — GLUCOSE, POCT (MANUAL RESULT ENTRY): POC Glucose: 233 mg/dl — AB (ref 70–99)

## 2018-03-10 MED ORDER — INSULIN GLARGINE 100 UNIT/ML SOLOSTAR PEN: PEN_INJECTOR | SUBCUTANEOUS | 11 refills | Status: DC

## 2018-03-10 MED ORDER — MELOXICAM 15 MG PO TABS: ORAL_TABLET | ORAL | 4 refills | Status: DC

## 2018-03-10 NOTE — Patient Instructions (Signed)
[facility]:  [phone]  

## 2018-03-10 NOTE — Progress Notes (Signed)
   Subjective:    Patient ID: Thomas Ryan, male    DOB: [birth year], 57 y.o.   MRN: [id number]  HPI   1.  DM:  Did not go to fill out forms for Lantus with [facility].  His son in law, Koleen Nimroddrian, states his wife, also a patient here was to apply for the medication. Not checking sugars.  2.  HM:  Did not go get his free influenza vaccine, nor did he have his wife go.  Was given free voucher for both of them.  3.  Left low back pain with S2 radiculopathy:  Meloxicam helped, but he ran out.  Current Meds  Medication Sig  . aspirin 81 MG tablet Take 81 mg by mouth daily.  Marland Kitchen. glipiZIDE (GLUCOTROL) 10 MG tablet 1 tab by mouth twice daily with meals  . lisinopril-hydrochlorothiazide (PRINZIDE,ZESTORETIC) 20-12.5 MG tablet TAKE 1 TABLET BY MOUTH IN THE MORNING  . lovastatin (MEVACOR) 20 MG tablet TAKE 1 TABLET BY MOUTH AT BEDTIME  . metFORMIN (GLUCOPHAGE) 1000 MG tablet TAKE 1 TABLET BY MOUTH TWICE DAILY    No Known Allergies  Review of Systems     Objective:   Physical Exam NAD Looks like he just rolled out of bed, disheveled HEENT:  PERRL, EOMI, TMs pearly gray, throat without injection. Neck:  Supple, No adenopathy, no thyromegaly. Chest:  CTA CV:  RRR without murmur or rub, radial and DP pulses normal and equal. Low back:  Exam unchanged.         Assessment & Plan:  1.  DM:  Still issues with compliance. Patient and wife continue to blame each other for inability to care for themselves.   Rx for Lantus 10 units once daily again.  They promise to go apply.   Hold on repeat A1C as has not made any changes.  2.  Left low back pain:  PT referral, though expect will fail to follow up with this. Meloxicam 15 mg daily with food as needed.  3.  Hypertension:  Controlled.

## 2018-04-20 ENCOUNTER — Other Ambulatory Visit: Payer: Self-pay

## 2018-04-20 DIAGNOSIS — I10 Essential (primary) hypertension: Secondary | ICD-10-CM

## 2018-04-20 MED ORDER — LISINOPRIL-HYDROCHLOROTHIAZIDE 20-12.5 MG PO TABS: 1.0000 | ORAL_TABLET | Freq: Every morning | ORAL | 3 refills | Status: DC

## 2018-09-10 ENCOUNTER — Other Ambulatory Visit: Payer: Self-pay | Admitting: Internal Medicine

## 2018-10-24 ENCOUNTER — Other Ambulatory Visit: Payer: Self-pay | Admitting: Internal Medicine

## 2018-12-14 ENCOUNTER — Other Ambulatory Visit: Payer: Self-pay | Admitting: Internal Medicine

## 2018-12-14 DIAGNOSIS — E1165 Type 2 diabetes mellitus with hyperglycemia: Secondary | ICD-10-CM

## 2019-01-26 ENCOUNTER — Other Ambulatory Visit: Payer: Self-pay | Admitting: Internal Medicine

## 2019-01-26 DIAGNOSIS — I10 Essential (primary) hypertension: Secondary | ICD-10-CM

## 2019-01-26 DIAGNOSIS — E785 Hyperlipidemia, unspecified: Secondary | ICD-10-CM

## 2019-01-26 DIAGNOSIS — E1165 Type 2 diabetes mellitus with hyperglycemia: Secondary | ICD-10-CM

## 2019-01-29 ENCOUNTER — Telehealth: Payer: Self-pay | Admitting: Internal Medicine

## 2019-01-29 ENCOUNTER — Other Ambulatory Visit: Payer: Self-pay

## 2019-01-29 DIAGNOSIS — Z20822 Contact with and (suspected) exposure to covid-19: Secondary | ICD-10-CM

## 2019-01-29 NOTE — Telephone Encounter (Signed)
Patient called stating has been coughing with sore throat for 5 days and was requesting information where can be  tested for COVID19. Nicky provided pt. With free site testing on [place].  Pt. Was provided with both  quarantine  and isolation information. Verbalized understanding.

## 2019-01-30 LAB — NOVEL CORONAVIRUS, NAA: SARS-CoV-2, NAA: DETECTED — AB

## 2019-02-06 ENCOUNTER — Emergency Department (HOSPITAL_COMMUNITY): Payer: Self-pay

## 2019-02-06 ENCOUNTER — Other Ambulatory Visit: Payer: Self-pay

## 2019-02-06 ENCOUNTER — Emergency Department (HOSPITAL_COMMUNITY)
Admission: EM | Admit: 2019-02-06 | Discharge: 2019-02-06 | Disposition: A | Discharge status: Home / Self Care | Payer: Self-pay | Attending: Emergency Medicine | Admitting: Emergency Medicine

## 2019-02-06 DIAGNOSIS — U071 COVID-19: Secondary | ICD-10-CM

## 2019-02-06 DIAGNOSIS — I10 Essential (primary) hypertension: Secondary | ICD-10-CM | POA: Insufficient documentation

## 2019-02-06 DIAGNOSIS — Z20828 Contact with and (suspected) exposure to other viral communicable diseases: Secondary | ICD-10-CM | POA: Insufficient documentation

## 2019-02-06 DIAGNOSIS — Z7984 Long term (current) use of oral hypoglycemic drugs: Secondary | ICD-10-CM | POA: Insufficient documentation

## 2019-02-06 DIAGNOSIS — Z79899 Other long term (current) drug therapy: Secondary | ICD-10-CM | POA: Insufficient documentation

## 2019-02-06 DIAGNOSIS — E119 Type 2 diabetes mellitus without complications: Secondary | ICD-10-CM | POA: Insufficient documentation

## 2019-02-06 DIAGNOSIS — Z7982 Long term (current) use of aspirin: Secondary | ICD-10-CM | POA: Insufficient documentation

## 2019-02-06 LAB — CBC WITH DIFFERENTIAL/PLATELET
Abs Immature Granulocytes: 0.02 10*3/uL (ref 0.00–0.07)
Basophils Absolute: 0 10*3/uL (ref 0.0–0.1)
Basophils Relative: 0 %
Eosinophils Absolute: 0 10*3/uL (ref 0.0–0.5)
Eosinophils Relative: 0 %
HCT: 45.5 % (ref 39.0–52.0)
Hemoglobin: 15 g/dL (ref 13.0–17.0)
Immature Granulocytes: 0 %
Lymphocytes Relative: 13 %
Lymphs Abs: 0.8 10*3/uL (ref 0.7–4.0)
MCH: 26.9 pg (ref 26.0–34.0)
MCHC: 33 g/dL (ref 30.0–36.0)
MCV: 81.5 fL (ref 80.0–100.0)
Monocytes Absolute: 0.4 10*3/uL (ref 0.1–1.0)
Monocytes Relative: 7 %
Neutro Abs: 4.9 10*3/uL (ref 1.7–7.7)
Neutrophils Relative %: 80 %
Platelets: 221 10*3/uL (ref 150–400)
RBC: 5.58 MIL/uL (ref 4.22–5.81)
RDW: 12.7 % (ref 11.5–15.5)
WBC: 6.1 10*3/uL (ref 4.0–10.5)
nRBC: 0 % (ref 0.0–0.2)

## 2019-02-06 LAB — BASIC METABOLIC PANEL
Anion gap: 13 (ref 5–15)
BUN: 10 mg/dL (ref 6–20)
CO2: 24 mmol/L (ref 22–32)
Calcium: 8.6 mg/dL — ABNORMAL LOW (ref 8.9–10.3)
Chloride: 96 mmol/L — ABNORMAL LOW (ref 98–111)
Creatinine, Ser: 0.79 mg/dL (ref 0.61–1.24)
GFR calc Af Amer: >60 mL/min (ref >60)
GFR calc non Af Amer: >60 mL/min (ref >60)
Glucose, Bld: 307 mg/dL — ABNORMAL HIGH (ref 70–99)
Potassium: 4.4 mmol/L (ref 3.5–5.1)
Sodium: 133 mmol/L — ABNORMAL LOW (ref 135–145)

## 2019-02-06 MED ORDER — AZITHROMYCIN 250 MG PO TABS: 250.0000 mg | ORAL_TABLET | Freq: Every day | ORAL | 0 refills | Status: DC

## 2019-02-06 MED ORDER — AZITHROMYCIN 250 MG PO TABS
500.0000 mg | ORAL_TABLET | Freq: Once | ORAL | Status: AC
  Administered 2019-02-06: 500 mg via ORAL
  Filled 2019-02-06: qty 2

## 2019-02-06 MED ORDER — SODIUM CHLORIDE 0.9 % IV BOLUS
1000.0000 mL | Freq: Once | INTRAVENOUS | Status: AC
  Administered 2019-02-06: 18:00:00 1000 mL via INTRAVENOUS

## 2019-02-06 MED ORDER — ACETAMINOPHEN 325 MG PO TABS
650.0000 mg | ORAL_TABLET | Freq: Once | ORAL | Status: AC
  Administered 2019-02-06: 19:00:00 650 mg via ORAL
  Filled 2019-02-06: qty 2
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
## 2019-02-06 NOTE — ED Triage Notes (Signed)
Tested positive 8 days ago for covid-- is a diabetic and now is short of breath and feels worse.

## 2019-02-06 NOTE — ED Notes (Signed)
Discharge instructions and prescription discussed with Pt. Pt verbalized understanding. Pt stable and ambulatory.    

## 2019-02-06 NOTE — ED Notes (Signed)
Pt ambulated in room, O2 sats stayed above 93% on RA.

## 2019-02-06 NOTE — ED Notes (Addendum)
[phone], Thomas Ryan; son in law please call with update on what the plan is.

## 2019-02-06 NOTE — Discharge Instructions (Addendum)
Thank you for allowing [place] to care for you today.   Please return to the emergency department if you have any new or worsening symptoms.  -Prescription sent to your pharmacy for azithromycin.  This is an antibiotic.  Please take as prescribed.  Medications- You can take medications to help treat your symptoms: -Tylenol for fever and body aches. Please take as prescribed on the bottle. -Over the coutner cough medicine such as mucinex, robitussin, or other brands.  Treatment- This is a virus and unfortunately there are no antibitotics approved to treat this virus at this time. It is important to monitor your symptoms closely: -You should have a theremometer at home to check your temperature when feeling feverish. -Use a pulse ox meter to measure your oxygen when feeling short of breath.  -If your fever is over 100.4 despite taking tylenol or if your oxygen level drops below 94% these are reasons to rturn to the emergency department for further evaluation. Please call the emergency department before you come to make [place] aware.    We recommend you self-isolate for 10 days and to inform your work/family/friends that you has the virus.  They will need to self-quarantine for 14 days to monitor for symptoms.    Again: symptoms of shortnessf breath, chest pain, difficulty breathing, new onset of confuison, any symptoms that are concerning. And you or the person should come to emergency department for evaluation.   I hope you feel better soon

## 2019-02-21 ENCOUNTER — Telehealth: Payer: Self-pay | Admitting: Internal Medicine

## 2019-02-21 ENCOUNTER — Encounter: Payer: Self-pay | Admitting: Internal Medicine

## 2019-02-21 NOTE — Telephone Encounter (Signed)
Per Dr. Amil Amen patient can have letter to return to work on Monday. Please notify patient he can pick this up tomorrow.

## 2019-02-21 NOTE — Telephone Encounter (Signed)
Patient called requesting a letter to be able to go back to work on Monday 12/07/20220.  Patient states he has not had COVID symptoms since Monday 02/19/2019.  Patient has met 14 days quarantine criteria.  Patient would like to pick up letter to return to work on Friday 02/23/2019.  To Dr. Amil Amen for approval.

## 2019-02-23 ENCOUNTER — Other Ambulatory Visit: Payer: Self-pay

## 2019-02-23 DIAGNOSIS — Z20822 Contact with and (suspected) exposure to covid-19: Secondary | ICD-10-CM

## 2019-02-24 LAB — NOVEL CORONAVIRUS, NAA: SARS-CoV-2, NAA: NOT DETECTED

## 2019-02-26 NOTE — Telephone Encounter (Signed)
Letter was picked up by patient 02/23/2019

## 2019-02-27 ENCOUNTER — Telehealth: Payer: Self-pay | Admitting: Internal Medicine

## 2019-02-27 NOTE — Telephone Encounter (Signed)
Patient informed. 

## 2019-04-17 ENCOUNTER — Other Ambulatory Visit: Payer: Self-pay

## 2019-04-18 ENCOUNTER — Other Ambulatory Visit: Payer: Self-pay | Admitting: Internal Medicine

## 2019-04-21 ENCOUNTER — Other Ambulatory Visit: Payer: Self-pay | Admitting: Internal Medicine

## 2019-04-21 DIAGNOSIS — I10 Essential (primary) hypertension: Secondary | ICD-10-CM

## 2019-05-21 ENCOUNTER — Other Ambulatory Visit: Payer: Self-pay | Admitting: Internal Medicine

## 2019-06-19 ENCOUNTER — Other Ambulatory Visit: Payer: Self-pay | Admitting: Internal Medicine

## 2019-06-19 DIAGNOSIS — I10 Essential (primary) hypertension: Secondary | ICD-10-CM

## 2019-08-19 ENCOUNTER — Other Ambulatory Visit: Payer: Self-pay | Admitting: Internal Medicine

## 2019-08-19 DIAGNOSIS — E1165 Type 2 diabetes mellitus with hyperglycemia: Secondary | ICD-10-CM

## 2019-09-13 ENCOUNTER — Encounter: Payer: Self-pay | Admitting: Internal Medicine

## 2019-09-13 ENCOUNTER — Ambulatory Visit: Payer: Self-pay | Admitting: Internal Medicine

## 2019-09-13 VITALS — BP 130/80 | HR 88 | Resp 12 | Ht 64.5 in | Wt 190.0 lb

## 2019-09-13 DIAGNOSIS — E785 Hyperlipidemia, unspecified: Secondary | ICD-10-CM

## 2019-09-13 DIAGNOSIS — N481 Balanitis: Secondary | ICD-10-CM

## 2019-09-13 DIAGNOSIS — E1165 Type 2 diabetes mellitus with hyperglycemia: Secondary | ICD-10-CM

## 2019-09-13 DIAGNOSIS — I10 Essential (primary) hypertension: Secondary | ICD-10-CM

## 2019-09-13 DIAGNOSIS — M791 Myalgia, unspecified site: Secondary | ICD-10-CM

## 2019-09-13 DIAGNOSIS — R5383 Other fatigue: Secondary | ICD-10-CM

## 2019-09-13 MED ORDER — GLIPIZIDE 10 MG PO TABS: ORAL_TABLET | ORAL | 3 refills | Status: DC

## 2019-09-13 MED ORDER — FLUCONAZOLE 150 MG PO TABS: ORAL_TABLET | ORAL | 0 refills | Status: DC

## 2019-09-13 MED ORDER — LOVASTATIN 20 MG PO TABS: ORAL_TABLET | ORAL | 3 refills | Status: DC

## 2019-09-13 NOTE — Progress Notes (Signed)
    Subjective:    Patient ID: Thomas Ryan, male   DOB: Dec 08, 1960, 59 y.o.   MRN: [id number]   HPI   Has not been seen since 02/2018, before pandemic.  1.  Was ill with COVID 02/06/2019.  Has never felt as if he ever recuperated.  Legs ache when working--particularly in right quadriceps.  Started hurting with his illness.  States the veins in his leg became more prominent since and hurt.  Has cramps in lower legs bilaterally. Also, has right chest pain--points to shoulder, and posterior thoracic area.   2.  DM:  States his sugars are running 160-180.  Checking sugars after breakfast or lunch.  3.  Hypertension:  Taking meds.  4.  Hyperlipidemia:  States taking meds.    5.  Repeated Yeast infection of penis--history of yeast balanitis.  Has had this in past because his sugars were not well controlled.    Current Meds  Medication Sig  . aspirin 81 MG tablet Take 81 mg by mouth daily.  Marland Kitchen glipiZIDE (GLUCOTROL) 10 MG tablet TAKE 1 TABLET BY MOUTH TWICE DAILY BEFORE A MEAL **STOP  GLIPIZIDE  5MG   . LANTUS SOLOSTAR 100 UNIT/ML Solostar Pen INJECT 10 UNITS INTO THE SKIN EVERY MORNING BEFORE BREAKFAST.  lisinopril-hydrochlorothiazide (ZESTORETIC) 20-12.5 MG tablet TAKE 1 TABLET BY MOUTH IN THE MORNING  . lovastatin (MEVACOR) 20 MG tablet TAKE 1 TABLET BY MOUTH AT BEDTIME  . meloxicam (MOBIC) 15 MG tablet TAKE 1 TABLET BY MOUTH ONCE DAILY WITH MEALS AS NEEDED FOR PAIN  . metFORMIN (GLUCOPHAGE) 1000 MG tablet Take 1 tablet by mouth twice daily   No Known Allergies   Review of Systems    Objective:   BP 130/80 (BP Location: Left Arm, Patient Position: Sitting, Cuff Size: Normal)   Pulse 88   Resp 12   Ht 5' 4.5" (1.638 m)   Wt 190 lb (86.2 kg)   BMI 32.11 kg/m   Physical Exam  NAD HEENT:  PERRL, EOMI, TMs pearly gray Neck:  Supple, No adenopathy, no thyromegaly Chest:  CTA CV:  RRR without murmur or rub.  Radial pulses normal and equal Abd:  S, NT, + BS MS:   Mild atrophy of left quad compared to right.  Varicosities of both legs. GU:  Erythema and wetness of glans  Assessment & Plan   1.  Fatigue and muscle pain:  ?long covid or other:  CBC, CMP, TSH when returns for fasting labs  2.  DM:  Suspect not controlled and part of his chronic fatigue..  A1C  3.  Dyslipidemia:  FLP  4.  Balanitis:  Control blood sugars and Fluconazole 150 mg daily for 3 days.  5.  Hypertension:  Fair control

## 2019-09-14 ENCOUNTER — Other Ambulatory Visit: Payer: Self-pay

## 2019-09-14 DIAGNOSIS — Z79899 Other long term (current) drug therapy: Secondary | ICD-10-CM

## 2019-09-14 DIAGNOSIS — M791 Myalgia, unspecified site: Secondary | ICD-10-CM

## 2019-09-14 DIAGNOSIS — E785 Hyperlipidemia, unspecified: Secondary | ICD-10-CM

## 2019-09-14 DIAGNOSIS — E1165 Type 2 diabetes mellitus with hyperglycemia: Secondary | ICD-10-CM

## 2019-09-14 DIAGNOSIS — E041 Nontoxic single thyroid nodule: Secondary | ICD-10-CM

## 2019-09-15 LAB — CBC WITH DIFFERENTIAL/PLATELET
Basophils Absolute: 0.1 10*3/uL (ref 0.0–0.2)
Basos: 1 %
EOS (ABSOLUTE): 1.6 10*3/uL — ABNORMAL HIGH (ref 0.0–0.4)
Eos: 17 %
Hematocrit: 47.6 % (ref 37.5–51.0)
Hemoglobin: 15 g/dL (ref 13.0–17.7)
Immature Grans (Abs): 0 10*3/uL (ref 0.0–0.1)
Immature Granulocytes: 0 %
Lymphocytes Absolute: 2.9 10*3/uL (ref 0.7–3.1)
Lymphs: 31 %
MCH: 26.5 pg — ABNORMAL LOW (ref 26.6–33.0)
MCHC: 31.5 g/dL (ref 31.5–35.7)
MCV: 84 fL (ref 79–97)
Monocytes Absolute: 0.6 10*3/uL (ref 0.1–0.9)
Monocytes: 6 %
Neutrophils Absolute: 4.4 10*3/uL (ref 1.4–7.0)
Neutrophils: 45 %
Platelets: 217 10*3/uL (ref 150–450)
RBC: 5.67 x10E6/uL (ref 4.14–5.80)
RDW: 14.2 % (ref 11.6–15.4)
WBC: 9.5 10*3/uL (ref 3.4–10.8)

## 2019-09-15 LAB — LIPID PANEL W/O CHOL/HDL RATIO
Cholesterol, Total: 142 mg/dL (ref 100–199)
HDL: 41 mg/dL (ref >39)
LDL Chol Calc (NIH): 78 mg/dL (ref 0–99)
Triglycerides: 131 mg/dL (ref 0–149)
VLDL Cholesterol Cal: 23 mg/dL (ref 5–40)

## 2019-09-15 LAB — MICROALBUMIN / CREATININE URINE RATIO
Creatinine, Urine: 257.1 mg/dL
Microalb/Creat Ratio: 11 mg/g creat (ref 0–29)
Microalbumin, Urine: 27.6 ug/mL

## 2019-09-15 LAB — COMPREHENSIVE METABOLIC PANEL
ALT: 24 IU/L (ref 0–44)
AST: 22 IU/L (ref 0–40)
Albumin/Globulin Ratio: 1.9 (ref 1.2–2.2)
Albumin: 4.3 g/dL (ref 3.8–4.9)
Alkaline Phosphatase: 109 IU/L (ref 48–121)
BUN/Creatinine Ratio: 17 (ref 9–20)
BUN: 15 mg/dL (ref 6–24)
Bilirubin Total: 0.7 mg/dL (ref 0.0–1.2)
CO2: 23 mmol/L (ref 20–29)
Calcium: 9.5 mg/dL (ref 8.7–10.2)
Chloride: 98 mmol/L (ref 96–106)
Creatinine, Ser: 0.89 mg/dL (ref 0.76–1.27)
GFR calc Af Amer: 108 mL/min/{1.73_m2} (ref >59)
GFR calc non Af Amer: 94 mL/min/{1.73_m2} (ref >59)
Globulin, Total: 2.3 g/dL (ref 1.5–4.5)
Glucose: 222 mg/dL — ABNORMAL HIGH (ref 65–99)
Potassium: 4.9 mmol/L (ref 3.5–5.2)
Sodium: 134 mmol/L (ref 134–144)
Total Protein: 6.6 g/dL (ref 6.0–8.5)

## 2019-09-15 LAB — HGB A1C W/O EAG: Hgb A1c MFr Bld: 11.4 % — ABNORMAL HIGH (ref 4.8–5.6)

## 2019-09-15 LAB — CK: Total CK: 327 U/L (ref 41–331)

## 2019-09-15 LAB — TSH: TSH: 1.74 u[IU]/mL (ref 0.450–4.500)

## 2019-09-15 LAB — SEDIMENTATION RATE: Sed Rate: 7 mm/hr (ref 0–30)

## 2019-10-08 ENCOUNTER — Encounter (HOSPITAL_COMMUNITY): Payer: Self-pay | Admitting: Emergency Medicine

## 2019-10-08 ENCOUNTER — Emergency Department (HOSPITAL_COMMUNITY): Payer: Self-pay

## 2019-10-08 ENCOUNTER — Other Ambulatory Visit: Payer: Self-pay

## 2019-10-08 ENCOUNTER — Emergency Department (HOSPITAL_COMMUNITY)
Admission: EM | Admit: 2019-10-08 | Discharge: 2019-10-08 | Disposition: A | Discharge status: Home / Self Care | Payer: Self-pay | Attending: Emergency Medicine | Admitting: Emergency Medicine

## 2019-10-08 DIAGNOSIS — E11319 Type 2 diabetes mellitus with unspecified diabetic retinopathy without macular edema: Secondary | ICD-10-CM | POA: Insufficient documentation

## 2019-10-08 DIAGNOSIS — Z7984 Long term (current) use of oral hypoglycemic drugs: Secondary | ICD-10-CM | POA: Insufficient documentation

## 2019-10-08 DIAGNOSIS — I10 Essential (primary) hypertension: Secondary | ICD-10-CM | POA: Insufficient documentation

## 2019-10-08 DIAGNOSIS — M5432 Sciatica, left side: Secondary | ICD-10-CM | POA: Insufficient documentation

## 2019-10-08 DIAGNOSIS — Z79899 Other long term (current) drug therapy: Secondary | ICD-10-CM | POA: Insufficient documentation

## 2019-10-08 DIAGNOSIS — Z7982 Long term (current) use of aspirin: Secondary | ICD-10-CM | POA: Insufficient documentation

## 2019-10-08 MED ORDER — ACETAMINOPHEN 500 MG PO TABS: 500.0000 mg | ORAL_TABLET | Freq: Four times a day (QID) | ORAL | 0 refills | Status: AC | PRN

## 2019-10-08 MED ORDER — DICLOFENAC SODIUM 1 % EX GEL: 2.0000 g | Freq: Four times a day (QID) | CUTANEOUS | 1 refills | Status: DC

## 2019-10-08 MED ORDER — KETOROLAC TROMETHAMINE 60 MG/2ML IM SOLN
60.0000 mg | Freq: Once | INTRAMUSCULAR | Status: AC
  Administered 2019-10-08: 60 mg via INTRAMUSCULAR
  Filled 2019-10-08: qty 2

## 2019-10-08 NOTE — ED Triage Notes (Signed)
Pt/translator stated, Im having intense pain in left leg since last November.
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
## 2019-10-08 NOTE — ED Notes (Signed)
Patient verbalizes understanding of discharge instructions. Opportunity for questioning and answers were provided. Armband removed by staff, pt discharged from ED.  

## 2019-10-08 NOTE — Discharge Instructions (Addendum)
In addition to your already prescribed at home 10 mg oxycodone, like for you to also take Tylenol 500 mg and apply Voltaren gel, as needed.  I have placed an ambulatory referral for physical therapy given your ongoing pain symptoms in the setting of sciatica.  You will need to follow-up with your primary care provider for ongoing evaluation and management.  Return to the ED or seek immediate medical attention should you experience any numbness or weakness, fevers or chills, significant swelling, or any other new or worsening symptoms.  Adems de los 10 mg de oxicodona ya recetados en casa, como para que tambin tome Tylenol 500 mg y aplique el gel Voltaren, segn sea necesario.  He colocado una derivacin ambulatoria para fisioterapia debido a sus sntomas de dolor continuo en el contexto de la citica.  Deber hacer un seguimiento con su proveedor de atencin primaria para una evaluacin y un tratamiento continuos.  Regrese al servicio de urgencias o busque atencin mdica inmediata si experimenta entumecimiento o debilidad, fiebre o escalofros, hinchazn significativa o cualquier otro sntoma nuevo o que empeore.

## 2019-10-09 ENCOUNTER — Other Ambulatory Visit: Payer: Self-pay

## 2019-10-09 ENCOUNTER — Telehealth: Payer: Self-pay | Admitting: Internal Medicine

## 2019-10-09 MED ORDER — MELOXICAM 15 MG PO TABS: ORAL_TABLET | ORAL | 2 refills | Status: DC

## 2019-10-09 NOTE — Telephone Encounter (Signed)
Spoke with Dr. Delrae Alfred regarding patient. Per Dr. Delrae Alfred patient can have a refill on meloxicam. Please inform patient he is on the highest dose and please do not share medications with his wife. Please educate both patient on this. Rx sent to pharmacy.

## 2019-10-09 NOTE — Telephone Encounter (Signed)
Thomas Ryan pt's. Wife  called stating patient was seen yesterday at ED due to leg pain. Ms. Thomas Ryan stated patient was told to contact his PCP to increase the dose of his meloxican. Ms. Thomas Ryan stated patient has been taking her medication; Meloxican 5 mg.  Ms. Thomas Ryan informed with receive a call back this afternoon after shared this information with Dr. Delrae Alfred.   Verbalized understanding.

## 2019-10-09 NOTE — Telephone Encounter (Signed)
Spoke with wife; shared information regarding refill authorized and that patient is on the highest dose. Also;  informed do not share medications with her husband.  Verbalized understanding.

## 2019-12-12 ENCOUNTER — Other Ambulatory Visit: Payer: Self-pay | Admitting: Internal Medicine

## 2019-12-12 DIAGNOSIS — E1165 Type 2 diabetes mellitus with hyperglycemia: Secondary | ICD-10-CM

## 2019-12-20 ENCOUNTER — Encounter: Payer: Self-pay | Admitting: Internal Medicine

## 2019-12-20 ENCOUNTER — Ambulatory Visit: Payer: Self-pay | Admitting: Internal Medicine

## 2019-12-20 VITALS — BP 134/80 | HR 80 | Resp 12 | Ht 64.5 in | Wt 187.0 lb

## 2019-12-20 DIAGNOSIS — E785 Hyperlipidemia, unspecified: Secondary | ICD-10-CM

## 2019-12-20 DIAGNOSIS — M5441 Lumbago with sciatica, right side: Secondary | ICD-10-CM

## 2019-12-20 DIAGNOSIS — M5442 Lumbago with sciatica, left side: Secondary | ICD-10-CM

## 2019-12-20 DIAGNOSIS — E1165 Type 2 diabetes mellitus with hyperglycemia: Secondary | ICD-10-CM

## 2019-12-20 DIAGNOSIS — I10 Essential (primary) hypertension: Secondary | ICD-10-CM

## 2019-12-20 DIAGNOSIS — G8929 Other chronic pain: Secondary | ICD-10-CM

## 2019-12-20 DIAGNOSIS — R6889 Other general symptoms and signs: Secondary | ICD-10-CM

## 2019-12-20 MED ORDER — GABAPENTIN 100 MG PO CAPS: ORAL_CAPSULE | ORAL | 11 refills | Status: DC

## 2019-12-20 NOTE — Progress Notes (Signed)
Subjective:    Patient ID: Thomas Ryan, male   DOB: Dec 17, 1960, 59 y.o.   MRN: [id number]   HPI   1.  DM:  A1C supporting poor control in June with 11.4% result.  States his wife is checking his blood glucose in the morning fasting and obtaining 130 range numbers.  They do not check in the afternoons as he gets home late in the evenings from work.   States he is taking his medications regularly.  Then states he has been out of Lantus for 6 weeks.  Suspect he did not apply for MAP as required.   Admits to poor motivation. Becomes tearful and asks for counseling with Danton Clap, LCSW.  2.  Leg pain:  Since suffered from COVID 19 in November 2020.    Most of pain in the quads bilaterally.  Also, has noted the veins in his right LE get more pronounced or "swollen"  Than the left.   Also, complains of pain in bilateral low back down back of legs to calves bilaterally.  Right is worse than left. Had leg pain prior.  He was prescribed Meloxicam for this in the past, but did not seem to help and ended up going to the ED and diagnosed with sciatica Telephonic PT as ordered in 2019 a year before COVID illness exercises helped, but did not continue and did not go for PT when referred by ED in 09/2019.  His orange card may be current.   3.  Hypertension:  Controlled  4.  Dyslipidemia:  Suspect may be missing Lovastatin--not clear.  Current Meds  Medication Sig  . aspirin 81 MG tablet Take 81 mg by mouth daily.  Marland Kitchen glipiZIDE (GLUCOTROL) 10 MG tablet TAKE 1 TABLET BY MOUTH TWICE DAILY BEFORE A MEAL **STOP  GLIPIZIDE  5MG   . LANTUS SOLOSTAR 100 UNIT/ML Solostar Pen INJECT 10 UNITS INTO THE SKIN EVERY MORNING BEFORE BREAKFAST.  lisinopril-hydrochlorothiazide (ZESTORETIC) 20-12.5 MG tablet TAKE 1 TABLET BY MOUTH IN THE MORNING  . lovastatin (MEVACOR) 20 MG tablet 1 tab by mouth with evening meal  . metFORMIN (GLUCOPHAGE) 1000 MG tablet Take 1 tablet by mouth twice daily   No Known  Allergies   Review of Systems    Objective:   BP 134/80 (BP Location: Left Arm, Patient Position: Sitting, Cuff Size: Normal)   Pulse 80   Resp 12   Ht 5' 4.5" (1.638 m)   Wt 187 lb (84.8 kg)   BMI 31.60 kg/m   Physical Exam NAD HEENT:  PERRL, EOMI Neck:  Supple, No adenopathy, no thyromegaly Chest:  CTA CV:  RRR with normal S1 and S2, No S3, S4 or murmur.  No carotid bruit.  Carotid, radial, DP pulses normal and equal. Abd:  S, NT, No HSM or mass, + BS MS:  Tenderness over L/S spinous processes diffusely.  Mild bilateral paraspinous musculature tenderness. Neuro:  A & O x 3, CN II-XII grossly intact.  Motor 5/5 in LE, DTRs 2+/4 throughout in LE   Assessment & Plan  1.  DM:  Expect labs to not be good.  He will work with LCSW and lifestyle changes, motivation for taking meds and return for fasiting labs in 3 months with me 2d later.  2.  Hypertension:  Fair control  3.  Dyslipidemia:  As in #1.  4.  Chronic low back pain with sciatica:  Send for MR of L/S spine as patient states has active orange card.  Needs to keep the card current while order is in.  Consider PT again, but sounds like he feels he is too busy to go.

## 2019-12-21 NOTE — Progress Notes (Signed)
LCSW met with patient following warm hand off by provider due to noncompliance of health management with his diabetes. LCSW provided reflective listening as patient processed his reasoning with accepting recommendation of counseling. Patient reports "I want to do better, have had diabetes for like 13-14 years), I take my medications and work and my AC1 continues to be high. I get this anxiety, I can't stop to eat what I want when it's front of me, and then the consequences I see that. I want to be able to talk about other things such as my children, I've been married for about 35 years but it doesn't feel the same anymore." At the moment patient was seeking "advice", LCSW praised patient with recognizing he wants change and the step of accepting this referral is a step forward of motivation." LCSW informed initial session will be a clinical assessment to obtain/assess further symptoms. LCSW provided appointment 10/08 Friday via virtual updox 9am agreed to low cost of $10 per session approved by MD to assist with motivation and need of counseling.

## 2019-12-28 ENCOUNTER — Telehealth: Payer: Self-pay | Admitting: Clinical

## 2019-12-28 ENCOUNTER — Telehealth (INDEPENDENT_AMBULATORY_CARE_PROVIDER_SITE_OTHER): Payer: Self-pay | Admitting: Clinical

## 2019-12-28 DIAGNOSIS — F32A Depression, unspecified: Secondary | ICD-10-CM
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
## 2019-12-28 NOTE — Telephone Encounter (Signed)
LCSW contacted apologizing starting late (called at 9:13am), left a message and attempt to call again to see if he wanted to reschedule for a later time on this date or schedule for another date. Informed to call back using google voice number dialed from or calling the clinic. LCSW unable to facilitate session.

## 2020-01-04 ENCOUNTER — Telehealth (INDEPENDENT_AMBULATORY_CARE_PROVIDER_SITE_OTHER): Payer: Self-pay | Admitting: Clinical

## 2020-01-04 DIAGNOSIS — F32A Depression, unspecified: Secondary | ICD-10-CM

## 2020-01-09 NOTE — Progress Notes (Signed)
° °  THERAPY PROGRESS NOTE  Session Time: 9:00-10 am via  facetime. Participation Level: Active Behavioral Response: CasualAlertEuthymic Type of Therapy: Individual Therapy Treatment Goals addressed: Coping   Purpose: LCSW met with client for routine individual therapy to work towards treatment goals: coping with history of children's abandonment and relationship with wife.   Intervention: LCSW met with patient via facetime for routine individual therapy to continue to work towards coping with history of children's abandonment and relationship with wife. LCSW provided patient check in of his sugar levels and how he has been. LCSW provided intervention Supportive  Counseling as patient processed further his history and current circumstance with his wife as he is beginning to notice how this is influencing his eating behaviorials. LCSW assessed for SI/HI/command psychosis.  Effectiveness: Patient is alert x4 affect. Patient identified he is has maintained busy around his home. Reports sugar is okay and is making changes in eating by having vegetables, fish and more water. Patient reports thoughts and feelings of sadness recently as he gets to thinking about [place] and his parents, feeling guilty not being able to see them or do more for them.   Patient processed feeling guilty too regarding his daughters, reports not understanding why his wife was not supportive of him developing a relationship with his adult daughters. Here patient processed a strain relationship with wife. Patient shared this led to him to see friendships with others but his wife was not in agreement, patient reports not feeling happy with his marriage.   Patient recognized in this session these feelings are connected with his motivation for change for his health. Patient reports he is hoping with therapy able to process these feelings to continue to make changes.   Patient participated in session and able to recognize  his feelings. Progress towards goal is Ongoing. Patient denied active suicidal/homicidal/active psychosis.  Plan Patient offered next appointment for: 10/22/2021at 9am  Diagnosis: unspecified depressive disorder    [name], LCSW 01/09/2020

## 2020-01-11 ENCOUNTER — Telehealth (INDEPENDENT_AMBULATORY_CARE_PROVIDER_SITE_OTHER): Payer: Self-pay | Admitting: Clinical

## 2020-01-11 DIAGNOSIS — F32A Depression, unspecified: Secondary | ICD-10-CM

## 2020-01-11 NOTE — Progress Notes (Signed)
   THERAPY PROGRESS NOTE Via facetime-patient unable to use updox  Session Time: 9:00-10:00 Participation Level: Active Behavioral Response: CasualAlert and DrowsyEuthymic Type of Therapy: Individual Therapy Treatment Goals addressed: Coping with relationship with his wife, abandonment/grief and diabetes management.    Purpose: LCSW met with client for routine individual therapy to work towards treatment goals: Coping with relationship with his wife, abandonment/grief and diabetes management.   Intervention: LCSW met with patient for routine individual therapy to work towards treatment goals. LCSW provided brief check in how he has been since last session. LCSW provided patient  assessed for any significant events and assessed how he is doing today. LCSW asked patient to provide updates regarding sugar. LCSW utilized intervention of Supportive as patient processed feeling unappreciated with his wife. LCSW assessed for SI/HI/command psychosis.  Effectiveness: Patient is alert x4 affect. Patient identified feeling tired as he was unable to sleep, fell asleep at 3am and woke at 7am. Patient participated in session sharing feeling hopeful with referral completing by clinic and appointment next Friday regarding his back pain. Patient reports feeling sugar level being controlled, between 130-140, reports continuin to try to eat healthy. Reports 1 day out of the week felt a sensation to eat sweets. Reports drinking more water than juice.   In this session patient processed wife getting upset of him not picking up the phone when wife received the referral appointment. Patient processed wife in the past has looked and deleted friends of his socia media page. Patient reports they went to a party where the home was disorganized and wife judged it but did not show gratitude to him for keeping their home organized. Patient shared he has verbalized told her why he has made friendships with other women. Patient  shared he and his wife live in the same home but sleep in separate rooms.   LCSW provided reflective listening and supported patient's feelings. LCSW was not able to intervene much in this session as he wanted to process his feelings with his wife and their relationship. LCSW just provided patient reminder to continue to work towards his goals and well being for stressors with his wife not influence his health. Progress towards goal is Ongoing. Patient denied active suicidal/homicidal/active psychosis.  Plan Patient offered next appointment for: 01/25/2020 9am   Diagnosis: unspecified depressive disorder    [name], LCSW 01/11/2020

## 2020-01-25 ENCOUNTER — Telehealth: Payer: Self-pay | Admitting: Clinical

## 2020-01-25 NOTE — Telephone Encounter (Signed)
LCSW contacted patient via facetime and by call via google voice to check in for session. LCSW left message to contact clinic to schedule new appointment due to no answer.

## 2020-02-05 ENCOUNTER — Other Ambulatory Visit: Payer: Self-pay | Admitting: Internal Medicine

## 2020-02-27 ENCOUNTER — Emergency Department (HOSPITAL_BASED_OUTPATIENT_CLINIC_OR_DEPARTMENT_OTHER): Payer: Self-pay

## 2020-02-27 ENCOUNTER — Encounter (HOSPITAL_BASED_OUTPATIENT_CLINIC_OR_DEPARTMENT_OTHER): Payer: Self-pay | Admitting: Emergency Medicine

## 2020-02-27 ENCOUNTER — Emergency Department (HOSPITAL_BASED_OUTPATIENT_CLINIC_OR_DEPARTMENT_OTHER)
Admission: EM | Admit: 2020-02-27 | Discharge: 2020-02-27 | Disposition: A | Discharge status: Home / Self Care | Payer: Self-pay | Attending: Emergency Medicine | Admitting: Emergency Medicine

## 2020-02-27 ENCOUNTER — Other Ambulatory Visit: Payer: Self-pay

## 2020-02-27 DIAGNOSIS — Z7982 Long term (current) use of aspirin: Secondary | ICD-10-CM | POA: Insufficient documentation

## 2020-02-27 DIAGNOSIS — Z7984 Long term (current) use of oral hypoglycemic drugs: Secondary | ICD-10-CM | POA: Insufficient documentation

## 2020-02-27 DIAGNOSIS — Z79899 Other long term (current) drug therapy: Secondary | ICD-10-CM | POA: Insufficient documentation

## 2020-02-27 DIAGNOSIS — R0789 Other chest pain: Secondary | ICD-10-CM | POA: Insufficient documentation

## 2020-02-27 DIAGNOSIS — E1165 Type 2 diabetes mellitus with hyperglycemia: Secondary | ICD-10-CM | POA: Insufficient documentation

## 2020-02-27 DIAGNOSIS — Z794 Long term (current) use of insulin: Secondary | ICD-10-CM | POA: Insufficient documentation

## 2020-02-27 DIAGNOSIS — I10 Essential (primary) hypertension: Secondary | ICD-10-CM | POA: Insufficient documentation

## 2020-02-27 LAB — COMPREHENSIVE METABOLIC PANEL
ALT: 22 U/L (ref 0–44)
AST: 23 U/L (ref 15–41)
Albumin: 4.1 g/dL (ref 3.5–5.0)
Alkaline Phosphatase: 66 U/L (ref 38–126)
Anion gap: 7 (ref 5–15)
BUN: 16 mg/dL (ref 6–20)
CO2: 27 mmol/L (ref 22–32)
Calcium: 9.4 mg/dL (ref 8.9–10.3)
Chloride: 101 mmol/L (ref 98–111)
Creatinine, Ser: 0.93 mg/dL (ref 0.61–1.24)
GFR, Estimated: >60 mL/min (ref >60)
Glucose, Bld: 261 mg/dL — ABNORMAL HIGH (ref 70–99)
Potassium: 3.6 mmol/L (ref 3.5–5.1)
Sodium: 135 mmol/L (ref 135–145)
Total Bilirubin: 0.6 mg/dL (ref 0.3–1.2)
Total Protein: 7 g/dL (ref 6.5–8.1)

## 2020-02-27 LAB — CBC WITH DIFFERENTIAL/PLATELET
Abs Immature Granulocytes: 0.02 10*3/uL (ref 0.00–0.07)
Basophils Absolute: 0.1 10*3/uL (ref 0.0–0.1)
Basophils Relative: 1 %
Eosinophils Absolute: 1.5 10*3/uL — ABNORMAL HIGH (ref 0.0–0.5)
Eosinophils Relative: 14 %
HCT: 41.9 % (ref 39.0–52.0)
Hemoglobin: 13.9 g/dL (ref 13.0–17.0)
Immature Granulocytes: 0 %
Lymphocytes Relative: 31 %
Lymphs Abs: 3.2 10*3/uL (ref 0.7–4.0)
MCH: 27.4 pg (ref 26.0–34.0)
MCHC: 33.2 g/dL (ref 30.0–36.0)
MCV: 82.6 fL (ref 80.0–100.0)
Monocytes Absolute: 0.7 10*3/uL (ref 0.1–1.0)
Monocytes Relative: 6 %
Neutro Abs: 5.1 10*3/uL (ref 1.7–7.7)
Neutrophils Relative %: 48 %
Platelets: 237 10*3/uL (ref 150–400)
RBC: 5.07 MIL/uL (ref 4.22–5.81)
RDW: 13.1 % (ref 11.5–15.5)
WBC: 10.6 10*3/uL — ABNORMAL HIGH (ref 4.0–10.5)
nRBC: 0 % (ref 0.0–0.2)

## 2020-02-27 LAB — TROPONIN I (HIGH SENSITIVITY): Troponin I (High Sensitivity): 4 ng/L (ref <18)

## 2020-02-27 NOTE — ED Provider Notes (Signed)
[facility] EMERGENCY DEPARTMENT Provider Note  CSN: [id number] Arrival date & time: 02/27/20 0112  Chief Complaint(s) Chest Pain  HPI Thomas Ryan is a 59 y.o. male with a past medical history as below including hypertension, hyperlipidemia and diabetes who presents to the emergency department with left-sided chest pain.. Onset 1 month ago.  Reports that it began while he was getting an MRI scan.  Initially burning sensation and then dull ache.  Pain has been persistent since onset.  He began having intermittent, brief, mild, sharp pains several days ago. Worse with movement and palpation. Alleviated by mobility. Reports that today after taking a shower he noted that his left chest appeared to be more swollen than the right -this is what actually prompted his visit to the emergency department. No fevers or chills.  No recent infection. No associated shortness of breath no nausea or vomiting no abdominal pain   HPI  Past Medical History Past Medical History:  Diagnosis Date  . Dyslipidemia 2008  . Elevated liver enzymes   . History of medication noncompliance   . Hypertension 2014   3 months  . Type II diabetes mellitus (HCC) 7-8 years   poorly controlled   Patient Active Problem List   Diagnosis Date Noted  . Diabetic retinopathy associated with type 2 diabetes mellitus (HCC) 06/07/2017  . Nodular thyroid disease 06/07/2017  . Hypertension   . Dyslipidemia   . Elevated liver enzymes   . History of medication noncompliance   . Chest pain   . Pain in a tooth or teeth   . Type 2 diabetes mellitus with hyperglycemia, without long-term current use of insulin (HCC)    Home Medication(s) Prior to Admission medications   Medication Sig Start Date End Date Taking? Authorizing Provider  acetaminophen (TYLENOL) 500 MG tablet Take 1 tablet (500 mg total) by mouth every 6 (six) hours as needed. 10/08/19   Lorelee New, PA-C  aspirin 81 MG tablet Take 81 mg  by mouth daily.    [provider]  gabapentin (NEURONTIN) 100 MG capsule 1 cap by mouth at bedtime and increase by 1 cap every 3 days until taking 3 caps at bedtime and hold at that dose. 12/20/19   Julieanne Manson, MD  glipiZIDE (GLUCOTROL) 10 MG tablet TAKE 1 TABLET BY MOUTH TWICE DAILY BEFORE A MEAL **STOP  GLIPIZIDE  5MG  09/13/19   09/15/19, MD  LANTUS SOLOSTAR 100 UNIT/ML Solostar Pen INJECT 10 UNITS INTO THE SKIN EVERY MORNING BEFORE BREAKFAST. 06/19/19   06/21/19, MD  lisinopril-hydrochlorothiazide (ZESTORETIC) 20-12.5 MG tablet TAKE 1 TABLET BY MOUTH IN THE MORNING 06/19/19   06/21/19, MD  lovastatin (MEVACOR) 20 MG tablet 1 tab by mouth with evening meal 09/13/19   09/15/19, MD  meloxicam [facility]) 15 MG tablet Take 1 tablet by mouth once daily with food 02/05/20   02/07/20, MD  metFORMIN (GLUCOPHAGE) 1000 MG tablet Take 1 tablet by mouth twice daily 12/12/19   12/14/19, MD  Past Surgical History Past Surgical History:  Procedure Laterality Date  . excision ingrown toenail     partial   Family History Family History  Problem Relation Age of Onset  . Diabetes Son   . Obesity Son   . Diabetes Daughter   . Obesity Daughter     Social History Social History   Tobacco Use  . Smoking status: Never Smoker  . Smokeless tobacco: Never Used  Vaping Use  . Vaping Use: Never used  Substance Use Topics  . Alcohol use: Not Currently    Alcohol/week: 0.0 standard drinks    Comment: HIstory of abuse--none since 04/2014  . Drug use: No   Allergies Patient has no known allergies.  Review of Systems Review of Systems All other systems are reviewed and are negative for acute change except as noted in the HPI  Physical Exam Vital Signs  I have reviewed the triage vital signs BP  122/77   Pulse 70   Temp 98 F (36.7 C) (Oral)   Resp 15   Ht 5\' 7"  (1.702 m)   Wt 88.9 kg   SpO2 98%   BMI 30.70 kg/m   Physical Exam Vitals reviewed.  Constitutional:      General: He is not in acute distress.    Appearance: He is well-developed. He is not diaphoretic.  HENT:     Head: Normocephalic and atraumatic.     Nose: Nose normal.  Eyes:     General: No scleral icterus.       Right eye: No discharge.        Left eye: No discharge.     Conjunctiva/sclera: Conjunctivae normal.     Pupils: Pupils are equal, round, and reactive to light.  Cardiovascular:     Rate and Rhythm: Normal rate and regular rhythm.     Heart sounds: No murmur heard.  No friction rub. No gallop.   Pulmonary:     Effort: Pulmonary effort is normal. No respiratory distress.     Breath sounds: Normal breath sounds. No stridor. No rales.    Chest:     Chest wall: Tenderness present.     Breasts:        Right: No swelling, mass, skin change or tenderness.        Left: No swelling, mass, skin change or tenderness.    Abdominal:     General: There is no distension.     Palpations: Abdomen is soft.     Tenderness: There is no abdominal tenderness.  Musculoskeletal:        General: No tenderness.     Cervical back: Normal range of motion and neck supple.  Skin:    General: Skin is warm and dry.     Findings: No erythema or rash.  Neurological:     Mental Status: He is alert and oriented to person, place, and time.     ED Results and Treatments Labs (all labs ordered are listed, but only abnormal results are displayed) Labs Reviewed  CBC WITH DIFFERENTIAL/PLATELET - Abnormal; Notable for the following components:      Result Value   WBC 10.6 (*)    Eosinophils Absolute 1.5 (*)    All other components within normal limits  COMPREHENSIVE METABOLIC PANEL - Abnormal; Notable for the following components:   Glucose, Bld 261 (*)    All other components within normal limits  TROPONIN I  (HIGH SENSITIVITY)  EKG  EKG Interpretation  Date/Time:  Wednesday February 27 2020 01:19:28 EST Ventricular Rate:  79 PR Interval:    QRS Duration: 145 QT Interval:  389 QTC Calculation: 446 R Axis:   -75 Text Interpretation: Sinus rhythm Right bundle branch block Inferior infarct, old Lateral leads are also involved Baseline wander in lead(s) V5 V6 No old tracing to compare Confirmed by Drema Pry [phone]) on 02/27/2020 1:35:33 AM      Radiology DG Chest Portable 1 View  Result Date: 02/27/2020 CLINICAL DATA:  Chest pain EXAM: PORTABLE CHEST 1 VIEW COMPARISON:  February 06, 2019 FINDINGS: The heart size and mediastinal contours are upper limits of normal. Both lungs are clear. The visualized skeletal structures are unremarkable. IMPRESSION: No active disease. Electronically Signed   By: Jonna Clark M.D.   On: 02/27/2020 01:46    Pertinent labs & imaging results that were available during my care of the patient were reviewed by me and considered in my medical decision making (see chart for details).  Medications Ordered in ED Medications - No data to display                                                                                                                                  Procedures Procedures  (including critical care time)  Medical Decision Making / ED Course I have reviewed the nursing notes for this encounter and the patient's prior records (if available in EHR or on provided paperwork).   Edmund Jerold Yoss was evaluated in Emergency Department on 02/27/2020 for the symptoms described in the history of present illness. He was evaluated in the context of the global COVID-19 pandemic, which necessitated consideration that the patient might be at risk for infection with the SARS-CoV-2 virus that causes COVID-19. Institutional protocols and  algorithms that pertain to the evaluation of patients at risk for COVID-19 are in a state of rapid change based on information released by regulatory bodies including the CDC and federal and state organizations. These policies and algorithms were followed during the patient's care in the ED.  Left-sided chest pain for 1 month. Highly atypical for ACS. EKG without acute ischemic changes or evidence of pericarditis. Initial troponin negative.  Given the duration of patient's pain and the highly atypical nature of it, I feel that this is sufficient to rule out ACS. Low suspicion for pulmonary embolism.  Presentation not classic for aortic dissection or esophageal perforation.  Chest x-ray without evidence suggestive of pneumonia, pneumothorax, pneumomediastinum.  No abnormal contour of the mediastinum to suggest dissection. No evidence of acute injuries.  No evidence of rash concerning for shingles.  No evidence concerning for deep tissue abscess or infection.  Likely MSK pain.      Final Clinical Impression(s) / ED Diagnoses Final diagnoses:  Chest wall pain    The patient appears reasonably screened and/or stabilized for discharge and I doubt any other medical condition or  other EMC requiring further screening, evaluation, or treatment in the ED at this time prior to discharge. Safe for discharge with strict return precautions.  Disposition: Discharge  Condition: Good  I have discussed the results, Dx and Tx plan with the patient/family who expressed understanding and agree(s) with the plan. Discharge instructions discussed at length. The patient/family was given strict return precautions who verbalized understanding of the instructions. No further questions at time of discharge.    ED Discharge Orders    None       Follow Up: Julieanne MansonMulberry, Elizabeth, MD [address] [place] [id number]  Call  If symptoms do not improve or  worsen     This chart was  dictated using voice recognition software.  Despite best efforts to proofread,  errors can occur which can change the documentation meaning.   Nira Connardama, Christophere Hillhouse Eduardo, MD 02/27/20 [phone]

## 2020-02-27 NOTE — ED Triage Notes (Signed)
Pt triaged using translator stratus.  Pt states he had a MRI on November 5 th and started having burning in his chest  Pt states he has had pain in his chest for several days  Pt states tonight he went to work and [employer] when he got home he took a shower and  started having pain on the left side of his chest around his heart and he feels like it is swollen and has a pinching sensation

## 2020-03-18 ENCOUNTER — Other Ambulatory Visit: Payer: Self-pay

## 2020-03-20 ENCOUNTER — Other Ambulatory Visit: Payer: Self-pay

## 2020-03-20 ENCOUNTER — Other Ambulatory Visit: Payer: Self-pay | Admitting: Internal Medicine

## 2020-03-20 DIAGNOSIS — I10 Essential (primary) hypertension: Secondary | ICD-10-CM

## 2020-03-20 DIAGNOSIS — Z125 Encounter for screening for malignant neoplasm of prostate: Secondary | ICD-10-CM

## 2020-03-20 DIAGNOSIS — Z79899 Other long term (current) drug therapy: Secondary | ICD-10-CM

## 2020-03-20 DIAGNOSIS — E1165 Type 2 diabetes mellitus with hyperglycemia: Secondary | ICD-10-CM

## 2020-03-20 DIAGNOSIS — E785 Hyperlipidemia, unspecified: Secondary | ICD-10-CM

## 2020-03-20 NOTE — Progress Notes (Signed)
Here for fasting labs:  CBC, CmP, FLP, A1C, PSA, urine microalbumin/crea

## 2020-03-21 LAB — COMPREHENSIVE METABOLIC PANEL
ALT: 22 IU/L (ref 0–44)
AST: 19 IU/L (ref 0–40)
Albumin/Globulin Ratio: 1.7 (ref 1.2–2.2)
Albumin: 4.7 g/dL (ref 3.8–4.9)
Alkaline Phosphatase: 81 IU/L (ref 44–121)
BUN/Creatinine Ratio: 23 — ABNORMAL HIGH (ref 9–20)
BUN: 20 mg/dL (ref 6–24)
Bilirubin Total: 0.9 mg/dL (ref 0.0–1.2)
CO2: 24 mmol/L (ref 20–29)
Calcium: 9.9 mg/dL (ref 8.7–10.2)
Chloride: 95 mmol/L — ABNORMAL LOW (ref 96–106)
Creatinine, Ser: 0.88 mg/dL (ref 0.76–1.27)
GFR calc Af Amer: 109 mL/min/{1.73_m2} (ref >59)
GFR calc non Af Amer: 94 mL/min/{1.73_m2} (ref >59)
Globulin, Total: 2.7 g/dL (ref 1.5–4.5)
Glucose: 196 mg/dL — ABNORMAL HIGH (ref 65–99)
Potassium: 5.3 mmol/L — ABNORMAL HIGH (ref 3.5–5.2)
Sodium: 137 mmol/L (ref 134–144)
Total Protein: 7.4 g/dL (ref 6.0–8.5)

## 2020-03-21 LAB — MICROALBUMIN / CREATININE URINE RATIO
Creatinine, Urine: 157.1 mg/dL
Microalb/Creat Ratio: 7 mg/g creat (ref 0–29)
Microalbumin, Urine: 10.3 ug/mL

## 2020-03-21 LAB — PSA: Prostate Specific Ag, Serum: 0.5 ng/mL (ref 0.0–4.0)

## 2020-03-21 LAB — CBC WITH DIFFERENTIAL/PLATELET
Basophils Absolute: 0.1 10*3/uL (ref 0.0–0.2)
Basos: 1 %
EOS (ABSOLUTE): 1.3 10*3/uL — ABNORMAL HIGH (ref 0.0–0.4)
Eos: 13 %
Hematocrit: 46.9 % (ref 37.5–51.0)
Hemoglobin: 15.6 g/dL (ref 13.0–17.7)
Immature Grans (Abs): 0 10*3/uL (ref 0.0–0.1)
Immature Granulocytes: 0 %
Lymphocytes Absolute: 2.5 10*3/uL (ref 0.7–3.1)
Lymphs: 25 %
MCH: 27.3 pg (ref 26.6–33.0)
MCHC: 33.3 g/dL (ref 31.5–35.7)
MCV: 82 fL (ref 79–97)
Monocytes Absolute: 0.6 10*3/uL (ref 0.1–0.9)
Monocytes: 6 %
Neutrophils Absolute: 5.7 10*3/uL (ref 1.4–7.0)
Neutrophils: 55 %
Platelets: 252 10*3/uL (ref 150–450)
RBC: 5.71 x10E6/uL (ref 4.14–5.80)
RDW: 13.5 % (ref 11.6–15.4)
WBC: 10.3 10*3/uL (ref 3.4–10.8)

## 2020-03-21 LAB — LIPID PANEL W/O CHOL/HDL RATIO
Cholesterol, Total: 163 mg/dL (ref 100–199)
HDL: 43 mg/dL (ref >39)
LDL Chol Calc (NIH): 89 mg/dL (ref 0–99)
Triglycerides: 183 mg/dL — ABNORMAL HIGH (ref 0–149)
VLDL Cholesterol Cal: 31 mg/dL (ref 5–40)

## 2020-03-21 LAB — HGB A1C W/O EAG: Hgb A1c MFr Bld: 10 % — ABNORMAL HIGH (ref 4.8–5.6)

## 2020-03-27 ENCOUNTER — Ambulatory Visit: Payer: Self-pay | Admitting: Internal Medicine

## 2020-04-24 ENCOUNTER — Telehealth: Payer: Self-pay | Admitting: Internal Medicine

## 2020-04-24 NOTE — Telephone Encounter (Signed)
Patient called requesting an appointment. Patient stated that he is having pain on left big toe that stated about 3 days ago, and yesterday noticed that is purple around the toenail as well. Patient stated that looks like it has pus on his toenail but is not coming out. Patient is concern because he is diabetic. Please advise.

## 2020-04-25 ENCOUNTER — Other Ambulatory Visit: Payer: Self-pay

## 2020-04-25 ENCOUNTER — Ambulatory Visit (INDEPENDENT_AMBULATORY_CARE_PROVIDER_SITE_OTHER): Payer: Self-pay | Admitting: Internal Medicine

## 2020-04-25 ENCOUNTER — Encounter: Payer: Self-pay | Admitting: Internal Medicine

## 2020-04-25 VITALS — BP 135/77 | HR 88 | Resp 12 | Ht 64.5 in | Wt 192.0 lb

## 2020-04-25 DIAGNOSIS — L03032 Cellulitis of left toe: Secondary | ICD-10-CM

## 2020-04-25 MED ORDER — AMOXICILLIN-POT CLAVULANATE 875-125 MG PO TABS: ORAL_TABLET | ORAL | 0 refills | Status: DC

## 2020-04-25 NOTE — Patient Instructions (Signed)
Elevate foot Soak foot twice daily for 20 minutes in warm, soapy water, pat dry and redress.

## 2020-04-25 NOTE — Progress Notes (Signed)
    Subjective:    Patient ID: Thomas Ryan, male   DOB: [birth year], 60 y.o.   MRN: [id number]   HPI  Left great toe with infection along nail with swelling and redness of distal toe.  No history of injury. No fever.  Has not been cutting nails deeply.  Sugars running in 200s.     No Known Allergies   Review of Systems    Objective:   BP 135/77 (BP Location: Left Arm, Patient Position: Sitting, Cuff Size: Normal)   Pulse 88   Resp 12   Ht 5' 4.5" (1.638 m)   Wt 192 lb (87.1 kg)   BMI 32.45 kg/m   Physical Exam Area of pustular collection along medial nail, left great toe.  11 blade used to I & D with good result.  Distal toe with mild swelling and erythema. Assessment & Plan   Paronychium left great toe.  I & Ded.  Augmentin 875/125 mg twice daily for 10 days.  Keep foot elevated.  Call if worsens.   Warm soaks.

## 2020-04-28 NOTE — Telephone Encounter (Signed)
Patient seen.

## 2020-05-01 ENCOUNTER — Telehealth: Payer: Self-pay | Admitting: Internal Medicine

## 2020-05-01 NOTE — Telephone Encounter (Signed)
Patient called stating his left foot is healed and he is ready to go back to work but his employer is requesting a letter from PCP stating he is able to return to work on Monday, February 14th.  Pt asked if letter can be ready for pick up tomorrow.

## 2020-05-01 NOTE — Telephone Encounter (Signed)
I gave him a letter to return to work on Tuesday.  What did he do with that?  He needs to give that letter to his boss.

## 2020-05-02 NOTE — Telephone Encounter (Signed)
Spoke to patient and states he did not give his boss the letter given by Dr. Delrae Alfred at the office visit. Pt. Informed he needs to give the letter to his boss . Pt. States he will.

## 2020-07-16 ENCOUNTER — Other Ambulatory Visit: Payer: Self-pay | Admitting: Internal Medicine

## 2020-07-31 ENCOUNTER — Other Ambulatory Visit: Payer: Self-pay

## 2020-07-31 ENCOUNTER — Encounter: Payer: Self-pay | Admitting: Internal Medicine

## 2020-07-31 ENCOUNTER — Ambulatory Visit: Payer: Self-pay | Admitting: Internal Medicine

## 2020-07-31 VITALS — BP 138/78 | HR 74 | Resp 12 | Ht 64.5 in | Wt 195.0 lb

## 2020-07-31 DIAGNOSIS — M5441 Lumbago with sciatica, right side: Secondary | ICD-10-CM

## 2020-07-31 DIAGNOSIS — M5442 Lumbago with sciatica, left side: Secondary | ICD-10-CM

## 2020-07-31 DIAGNOSIS — E875 Hyperkalemia: Secondary | ICD-10-CM

## 2020-07-31 DIAGNOSIS — E1165 Type 2 diabetes mellitus with hyperglycemia: Secondary | ICD-10-CM

## 2020-07-31 DIAGNOSIS — G8929 Other chronic pain: Secondary | ICD-10-CM

## 2020-07-31 DIAGNOSIS — E785 Hyperlipidemia, unspecified: Secondary | ICD-10-CM

## 2020-07-31 DIAGNOSIS — I10 Essential (primary) hypertension: Secondary | ICD-10-CM

## 2020-07-31 DIAGNOSIS — R6889 Other general symptoms and signs: Secondary | ICD-10-CM

## 2020-07-31 MED ORDER — GABAPENTIN 100 MG PO CAPS
ORAL_CAPSULE | ORAL | 11 refills | Status: DC
Start: 2020-07-31 — End: 2021-03-20

## 2020-07-31 MED ORDER — METFORMIN HCL 1000 MG PO TABS
1.0000 | ORAL_TABLET | Freq: Two times a day (BID) | ORAL | 3 refills | Status: DC
Start: 2020-07-31 — End: 2021-02-09

## 2020-07-31 MED ORDER — LOVASTATIN 20 MG PO TABS
ORAL_TABLET | ORAL | 3 refills | Status: DC
Start: 2020-07-31 — End: 2021-10-13

## 2020-07-31 MED ORDER — GLIPIZIDE 10 MG PO TABS
ORAL_TABLET | ORAL | 3 refills | Status: DC
Start: 2020-07-31 — End: 2023-06-07

## 2020-07-31 MED ORDER — LISINOPRIL-HYDROCHLOROTHIAZIDE 20-12.5 MG PO TABS
1.0000 | ORAL_TABLET | Freq: Every morning | ORAL | 3 refills | Status: DC
Start: 2020-07-31 — End: 2020-11-04

## 2020-07-31 NOTE — Progress Notes (Signed)
Subjective:    Patient ID: Thomas Ryan, male   DOB: 1960/04/23, 60 y.o.   MRN: [id number]   HPI   Arlana Lindau interprets  1.  DM:  Remains poorly controlled.  Last A1C was 10.0 % in December.  States sugars 170-190 in the morning or in the evening.  Generally checking after dinner in the evening. States sugars do not go higher. Discussed with his A1C, his sugars are actually on average around 275.   Admits to not doing any physical activity any longer--too tired. States his low back hurts all the time as well.   2.  Low back pain without definitive radiculopathy, though does sound like he has separate leg complaints as well:  His daughter has him going to a chiropractor.  He is to go there twice weekly for 6 weeks.  MR of lumbar spine 11/2019 with mild changes--no significant findings.   Chiropractor would like his MR results.  Vesta Mixer.  [facility]. He was taking Gabapentin, but ran out about 2-3 months ago when unable to get orange card.  3.  Hyperlipidemia:  Not at goal in December, though not far off with LDL at 89.    4.  Depression:  Worked with Danton Clap, LCSW for a time.  He missed his appt in November and did not follow up.  5.  COVID vaccination:  Received 1st booster of Pfizer on 02/27/20.  Does not want the second booster today due to work.  Current Meds  Medication Sig   acetaminophen (TYLENOL) 500 MG tablet Take 1 tablet (500 mg total) by mouth every 6 (six) hours as needed.   aspirin 81 MG tablet Take 81 mg by mouth daily.   glipiZIDE (GLUCOTROL) 10 MG tablet TAKE 1 TABLET BY MOUTH TWICE DAILY BEFORE A MEAL **STOP  GLIPIZIDE  5MG    LANTUS SOLOSTAR 100 UNIT/ML Solostar Pen INJECT 10 UNITS INTO THE SKIN EVERY MORNING BEFORE BREAKFAST.   lisinopril-hydrochlorothiazide (ZESTORETIC) 20-12.5 MG tablet TAKE 1 TABLET BY MOUTH IN THE MORNING   lovastatin (MEVACOR) 20 MG tablet 1 tab by mouth with evening meal   metFORMIN (GLUCOPHAGE) 1000 MG  tablet Take 1 tablet by mouth twice daily   No Known Allergies   Review of Systems    Objective:   BP 138/78 (BP Location: Left Arm, Patient Position: Sitting, Cuff Size: Normal)   Pulse 74   Resp 12   Ht 5' 4.5" (1.638 m)   Wt 195 lb (88.5 kg)   BMI 32.95 kg/m   Physical Exam NAD HEENT:  PERRL, EOMI, discs sharp Neck:  supple, No adenopathy, no thyromegaly Chest:  CTA CV:  RRR without murmur or rub.  No carotid bruits.  Carotid, radial and DP pulses normal and equal. Abd:  S, NT, No HSM or mass. + BS LE:  No edema.  Assessment & Plan   1.  DM:  Increase lantus to 15 units daily.  Continue Metformin and Glipizide.  Encouraged again to make changes to diet and physical activity.  Encouraged finding an affordable pool for exercise [facility].)  A1C Encouraged yearly eye exam with even [facility]. Not clear how to help him get motivated   2.  Dyslipidemia:  FLP., CMP  Continue Lovastatin  3.  Back pain:  getting chiropractic care.  Copy of MR to Chiropractor.  Restart Gabapentin and titrate to 300 mg 3 times daily.  [facility]ail pharmacy.    4.  Hx of  hyperkalemia:  CMP

## 2020-08-01 LAB — COMPREHENSIVE METABOLIC PANEL
ALT: 28 IU/L (ref 0–44)
AST: 20 IU/L (ref 0–40)
Albumin/Globulin Ratio: 1.7 (ref 1.2–2.2)
Albumin: 4.3 g/dL (ref 3.8–4.9)
Alkaline Phosphatase: 84 IU/L (ref 44–121)
BUN/Creatinine Ratio: 15 (ref 9–20)
BUN: 14 mg/dL (ref 6–24)
Bilirubin Total: 0.6 mg/dL (ref 0.0–1.2)
CO2: 23 mmol/L (ref 20–29)
Calcium: 9.7 mg/dL (ref 8.7–10.2)
Chloride: 98 mmol/L (ref 96–106)
Creatinine, Ser: 0.92 mg/dL (ref 0.76–1.27)
Globulin, Total: 2.5 g/dL (ref 1.5–4.5)
Glucose: 195 mg/dL — ABNORMAL HIGH (ref 65–99)
Potassium: 4.7 mmol/L (ref 3.5–5.2)
Sodium: 136 mmol/L (ref 134–144)
Total Protein: 6.8 g/dL (ref 6.0–8.5)
eGFR: 96 mL/min/{1.73_m2} (ref >59)

## 2020-08-01 LAB — LIPID PANEL W/O CHOL/HDL RATIO
Cholesterol, Total: 130 mg/dL (ref 100–199)
HDL: 40 mg/dL (ref >39)
LDL Chol Calc (NIH): 65 mg/dL (ref 0–99)
Triglycerides: 143 mg/dL (ref 0–149)
VLDL Cholesterol Cal: 25 mg/dL (ref 5–40)

## 2020-08-01 LAB — HGB A1C W/O EAG: Hgb A1c MFr Bld: 11.1 % — ABNORMAL HIGH (ref 4.8–5.6)

## 2020-08-06 ENCOUNTER — Telehealth: Payer: Self-pay | Admitting: Internal Medicine

## 2020-08-06 MED ORDER — NIRMATRELVIR/RITONAVIR (PAXLOVID) TABLET (RENAL DOSING): 2.0000 | ORAL_TABLET | Freq: Two times a day (BID) | ORAL | 0 refills | Status: AC

## 2020-08-06 NOTE — Telephone Encounter (Signed)
Wife called on patients behalf. Patient tested positive today (08/06/2020). He only got tested because his wife was tested positive.

## 2020-08-06 NOTE — Telephone Encounter (Signed)
Covid testing by [facility] at [facility]. Tested today and symptoms started today

## 2020-08-06 NOTE — Telephone Encounter (Signed)
Patient's wife called to rectified about the COVID test result. Patient Thomas Ryan tested negative.

## 2020-09-09 NOTE — Progress Notes (Signed)
Pt informed of lab results. Notified that Doctor recommends daily physical activity to improve cholesterol . Confirmed with Pt that his lantus insulin has been increased and is checking blood glucose daily.

## 2020-10-05 ENCOUNTER — Other Ambulatory Visit: Payer: Self-pay | Admitting: Internal Medicine

## 2020-10-08 NOTE — Telephone Encounter (Signed)
[facility] pharmacy explained that it was an error on their end. To disregard message

## 2020-10-08 NOTE — Telephone Encounter (Signed)
Will you please call [facility] on [address] and find out why they are sending the message to refill his Lovastatin.  Sent a new Rx in May with 1 year's refills.  Are they sending as he has not filled since 2020 by their records?  Does not make sense as his cholesterol was at goal last check, so has to be taking.

## 2020-11-03 ENCOUNTER — Other Ambulatory Visit: Payer: Self-pay | Admitting: Internal Medicine

## 2020-11-21 IMAGING — DX DG CHEST 1V PORT
1 series · 1 of 1 positions shown · non-contrast
Comparison: Radiograph 07/05/2005

CLINICAL DATA: UFNK7-PL, shortness of breath

EXAM:
PORTABLE CHEST 1 VIEW

[chest]
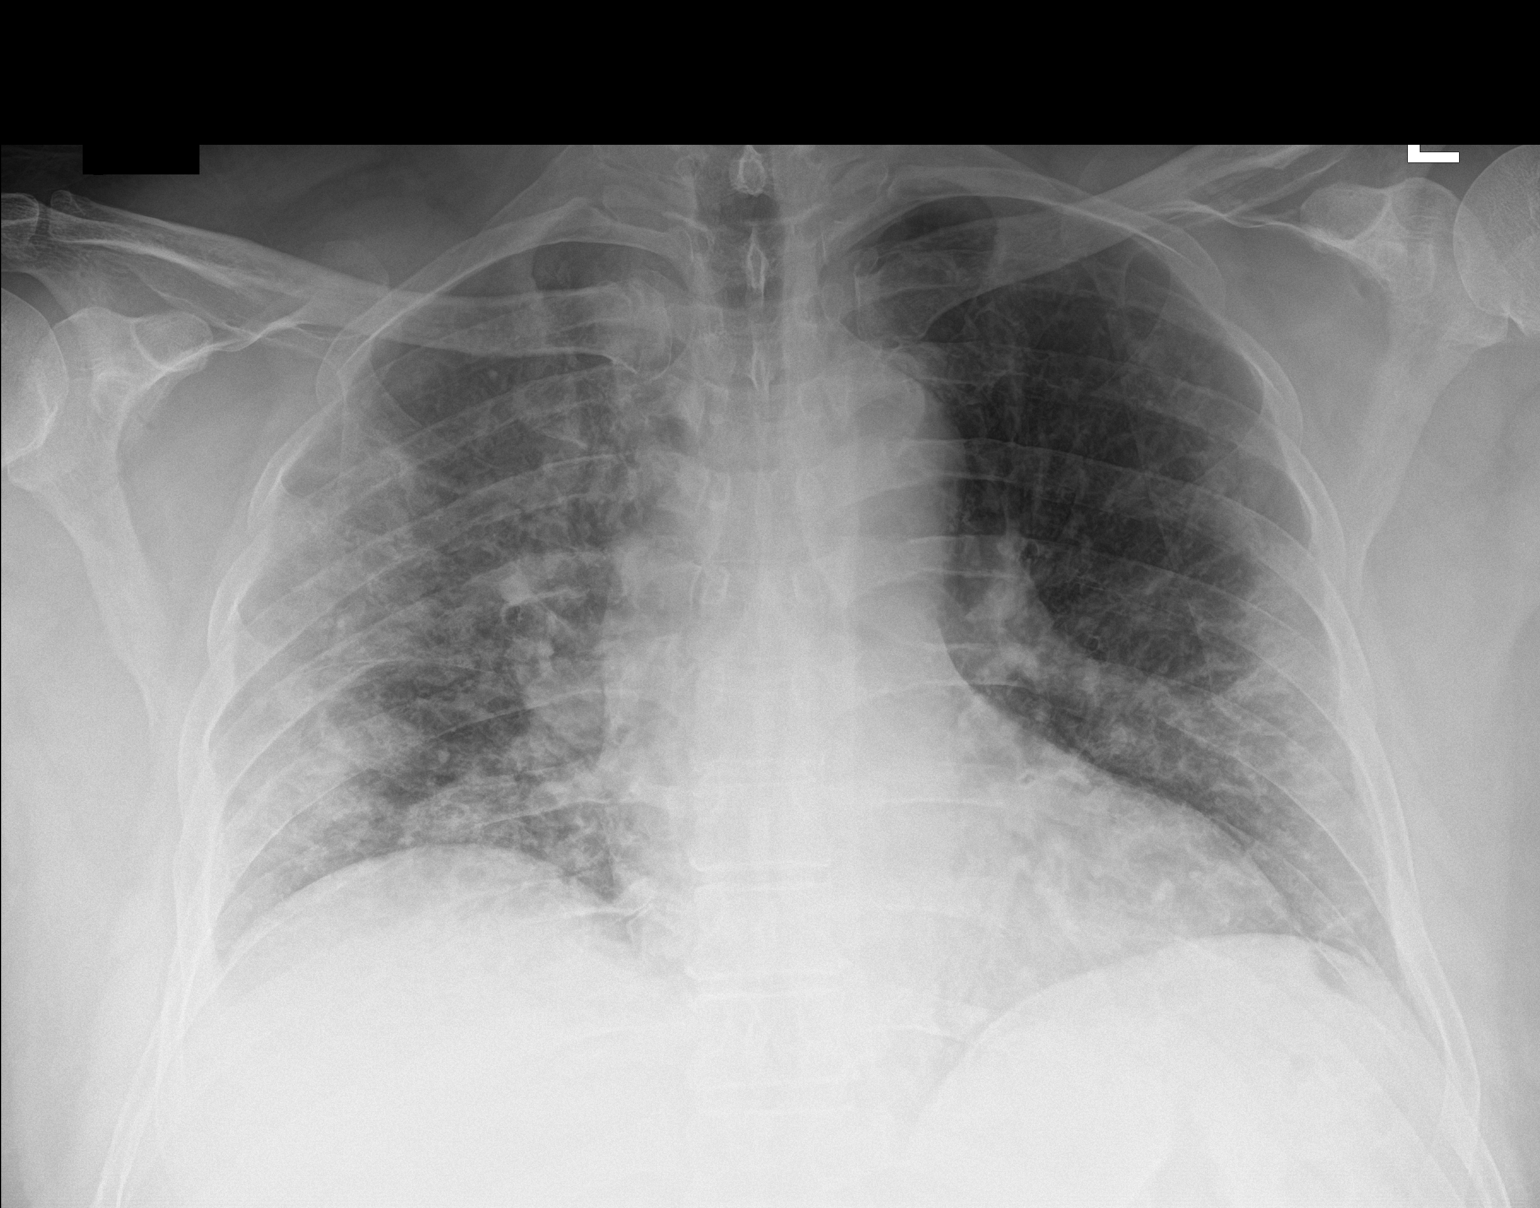

[1 of 1 positions shown; findings below may reference images not displayed]

FINDINGS: Multifocal peripheral predominant airspace disease with hazy
interstitial opacities as well. No pneumothorax. No visible
effusion. Cardiomediastinal contours unremarkable for the portable
technique. No acute osseous or soft tissue abnormality.
IMPRESSION: Multifocal peripheral predominant airspace disease with hazy
interstitial opacities, compatible with atypical viral pneumonia
such as UFNK7-PL.

## 2021-01-01 ENCOUNTER — Ambulatory Visit (INDEPENDENT_AMBULATORY_CARE_PROVIDER_SITE_OTHER): Payer: Self-pay | Admitting: Internal Medicine

## 2021-01-01 ENCOUNTER — Other Ambulatory Visit: Payer: Self-pay

## 2021-01-01 DIAGNOSIS — Z23 Encounter for immunization: Secondary | ICD-10-CM

## 2021-01-31 ENCOUNTER — Other Ambulatory Visit: Payer: Self-pay | Admitting: Internal Medicine

## 2021-02-02 ENCOUNTER — Other Ambulatory Visit: Payer: Self-pay

## 2021-02-02 DIAGNOSIS — E1165 Type 2 diabetes mellitus with hyperglycemia: Secondary | ICD-10-CM

## 2021-02-02 DIAGNOSIS — E785 Hyperlipidemia, unspecified: Secondary | ICD-10-CM

## 2021-02-03 LAB — HEMOGLOBIN A1C
Est. average glucose Bld gHb Est-mCnc: 258 mg/dL
Hgb A1c MFr Bld: 10.6 % — ABNORMAL HIGH (ref 4.8–5.6)

## 2021-03-20 ENCOUNTER — Encounter: Payer: Self-pay | Admitting: Internal Medicine

## 2021-03-20 ENCOUNTER — Ambulatory Visit: Payer: Self-pay | Admitting: Internal Medicine

## 2021-03-20 ENCOUNTER — Other Ambulatory Visit: Payer: Self-pay

## 2021-03-20 VITALS — BP 136/86 | HR 76 | Resp 12 | Ht 64.75 in | Wt 193.0 lb

## 2021-03-20 DIAGNOSIS — I10 Essential (primary) hypertension: Secondary | ICD-10-CM

## 2021-03-20 DIAGNOSIS — E785 Hyperlipidemia, unspecified: Secondary | ICD-10-CM

## 2021-03-20 DIAGNOSIS — M791 Myalgia, unspecified site: Secondary | ICD-10-CM

## 2021-03-20 DIAGNOSIS — E1165 Type 2 diabetes mellitus with hyperglycemia: Secondary | ICD-10-CM

## 2021-03-20 DIAGNOSIS — Z23 Encounter for immunization: Secondary | ICD-10-CM

## 2021-03-20 DIAGNOSIS — Z0000 Encounter for general adult medical examination without abnormal findings: Secondary | ICD-10-CM

## 2021-03-20 MED ORDER — METFORMIN HCL 1000 MG PO TABS
1000.0000 mg | ORAL_TABLET | Freq: Two times a day (BID) | ORAL | 3 refills | Status: DC
Start: 2021-03-20 — End: 2022-05-16

## 2021-03-20 MED ORDER — GABAPENTIN 300 MG PO CAPS
ORAL_CAPSULE | ORAL | 3 refills | Status: DC
Start: 2021-03-20 — End: 2023-06-07
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
## 2021-04-17 ENCOUNTER — Other Ambulatory Visit: Payer: Self-pay

## 2021-07-10 ENCOUNTER — Encounter: Payer: Self-pay | Admitting: Internal Medicine

## 2021-07-10 ENCOUNTER — Ambulatory Visit: Payer: Self-pay | Admitting: Internal Medicine

## 2021-07-10 VITALS — BP 122/68 | HR 80 | Resp 20 | Ht 64.75 in | Wt 192.8 lb

## 2021-07-10 DIAGNOSIS — I10 Essential (primary) hypertension: Secondary | ICD-10-CM

## 2021-07-10 DIAGNOSIS — Z79899 Other long term (current) drug therapy: Secondary | ICD-10-CM

## 2021-07-10 DIAGNOSIS — E1165 Type 2 diabetes mellitus with hyperglycemia: Secondary | ICD-10-CM

## 2021-07-10 DIAGNOSIS — E785 Hyperlipidemia, unspecified: Secondary | ICD-10-CM

## 2021-07-10 DIAGNOSIS — M545 Low back pain, unspecified: Secondary | ICD-10-CM

## 2021-07-10 DIAGNOSIS — G8929 Other chronic pain: Secondary | ICD-10-CM

## 2021-07-10 MED ORDER — GABAPENTIN 100 MG PO CAPS: ORAL_CAPSULE | ORAL | 3 refills | Status: DC

## 2021-07-10 MED ORDER — MELOXICAM 15 MG PO TABS: ORAL_TABLET | ORAL | 4 refills | Status: AC

## 2021-07-10 MED ORDER — GABAPENTIN 100 MG PO CAPS: ORAL_CAPSULE | ORAL | 0 refills | Status: DC

## 2021-07-10 NOTE — Progress Notes (Signed)
Subjective:    Patient ID: Thomas Ryan, male   DOB: Sep 11, 1960, 61 y.o.   MRN: [id number]   HPI  Thomas Ryan interprets  Patient did not follow up as recommended after CPE in December.  Was to have fasting labs within a month after the visit, but did not come in as he "lost his orange card"  Discussed would be the same charge no matter what and he needs to notify what is keeping him from coming in.     DM:  historically has a poor diet.  States he wants something to suppress his appetite.  Wants to eat chocolate all the time.  Tries to eat just small amount, but feels his body"wants more sugar"  and ends up eating a lot.  Keeps a large chocolate bar at home.  Describes having cookies, snacks and bread in his home and having difficulty staying away from it.  Blames his wife for having the cookies and snacks in the home.  2.  Having low back pain maybe once a month since suffering COVID.  Had COVID 01/2019.  He is concerned it is his lungs hurting, but no dyspnea with this pain.  States the pain was similar to when he was ill with COVID, so assuming it's his lungs hurting.  Discomfort can last 2-3 days and then resolves.  Has not taken anything OTC for the pain.    3.  Hypertension:  controlled.  4.  Peripheral Neuropathy in feet:  States discomfort is improved with the increase to 300 mg of Gabapentin at bedtime.  Still with discomfort after returns home from work and puts feet up.      Current Meds  Medication Sig   acetaminophen (TYLENOL) 500 MG tablet Take 1 tablet (500 mg total) by mouth every 6 (six) hours as needed.   aspirin 81 MG tablet Take 81 mg by mouth daily.   gabapentin (NEURONTIN) 300 MG capsule 1 cap by mouth daily at bedtime   glipiZIDE (GLUCOTROL) 10 MG tablet TAKE 1 TABLET BY MOUTH TWICE DAILY BEFORE A MEAL   LANTUS SOLOSTAR 100 UNIT/ML Solostar Pen INJECT 10 UNITS INTO THE SKIN EVERY MORNING BEFORE BREAKFAST. (Patient taking differently: INJECT 10 UNITS  INTO THE SKIN EVERY MORNING BEFORE BREAKFAST. Pt currently injects 15 units every morning)   lisinopril-hydrochlorothiazide (ZESTORETIC) 20-12.5 MG tablet TAKE 1 TABLET BY MOUTH IN THE MORNING   lovastatin (MEVACOR) 20 MG tablet 1 tab by mouth with evening meal   metFORMIN (GLUCOPHAGE) 1000 MG tablet Take 1 tablet (1,000 mg total) by mouth 2 (two) times daily.   No Known Allergies   Review of Systems    Objective:   BP 122/68 (BP Location: Right Arm, Patient Position: Sitting, Cuff Size: Normal)   Pulse 80   Resp 20   Ht 5' 4.75" (1.645 m)   Wt 192 lb 12 oz (87.4 kg)   BMI 32.32 kg/m   Physical Exam NAD Lungs:  CTA CV:  RRR without murmur or rub.  Radial and DP pulses normal and equal Abd:  S, NT, No HSM or mass. + BS MS:  No tenderness of back Neuro:  gait normal  Assessment & Plan    HM:  hold on Td and Moderna bivalent 2nd booster--daughter getting married tomorrow  2.  DM:  decided against GPL1 agonist as another injected medication.  Will see what A1C is.  3.  Chronic low back pain and Peripheral neuropathy:  he will call when  switching from 100 mg caps to the 300 mg caps in the morning so we can increase his caps per month with 300s.  Meloxicam for low back pain  4.  Hypertension:  controlled  5.  Dyslipidemia:  Fasting labs today

## 2021-07-10 NOTE — Patient Instructions (Signed)
Gabapentin 100 mg capsula:  1 capsula en la manana.  En 3 dias, sube a 2 capsulas en la manana, en 3 mas dias, sube a 3 capsulas en la manana ? ?Gabapentin 300 mg capsula:  continue 1 capsula en la noche ?

## 2021-07-12 LAB — CBC WITH DIFFERENTIAL/PLATELET
Basophils Absolute: 0.1 10*3/uL (ref 0.0–0.2)
Basos: 1 %
EOS (ABSOLUTE): 1 10*3/uL — ABNORMAL HIGH (ref 0.0–0.4)
Eos: 10 %
Hematocrit: 46.4 % (ref 37.5–51.0)
Hemoglobin: 15.5 g/dL (ref 13.0–17.7)
Immature Grans (Abs): 0 10*3/uL (ref 0.0–0.1)
Immature Granulocytes: 0 %
Lymphocytes Absolute: 2.6 10*3/uL (ref 0.7–3.1)
Lymphs: 24 %
MCH: 27.1 pg (ref 26.6–33.0)
MCHC: 33.4 g/dL (ref 31.5–35.7)
MCV: 81 fL (ref 79–97)
Monocytes Absolute: 0.5 10*3/uL (ref 0.1–0.9)
Monocytes: 5 %
Neutrophils Absolute: 6.4 10*3/uL (ref 1.4–7.0)
Neutrophils: 60 %
Platelets: 228 10*3/uL (ref 150–450)
RBC: 5.71 x10E6/uL (ref 4.14–5.80)
RDW: 14.2 % (ref 11.6–15.4)
WBC: 10.7 10*3/uL (ref 3.4–10.8)

## 2021-07-12 LAB — COMPREHENSIVE METABOLIC PANEL
ALT: 23 IU/L (ref 0–44)
AST: 18 IU/L (ref 0–40)
Albumin/Globulin Ratio: 1.5 (ref 1.2–2.2)
Albumin: 4.2 g/dL (ref 3.8–4.9)
Alkaline Phosphatase: 113 IU/L (ref 44–121)
BUN/Creatinine Ratio: 18 (ref 10–24)
BUN: 18 mg/dL (ref 8–27)
Bilirubin Total: 0.3 mg/dL (ref 0.0–1.2)
CO2: 21 mmol/L (ref 20–29)
Calcium: 9.7 mg/dL (ref 8.6–10.2)
Chloride: 102 mmol/L (ref 96–106)
Creatinine, Ser: 0.99 mg/dL (ref 0.76–1.27)
Globulin, Total: 2.8 g/dL (ref 1.5–4.5)
Glucose: 222 mg/dL — ABNORMAL HIGH (ref 70–99)
Potassium: 4.6 mmol/L (ref 3.5–5.2)
Sodium: 137 mmol/L (ref 134–144)
Total Protein: 7 g/dL (ref 6.0–8.5)
eGFR: 87 mL/min/{1.73_m2} (ref >59)

## 2021-07-12 LAB — MICROALBUMIN / CREATININE URINE RATIO
Creatinine, Urine: 184.7 mg/dL
Microalb/Creat Ratio: 4 mg/g creat (ref 0–29)
Microalbumin, Urine: 6.6 ug/mL

## 2021-07-12 LAB — HGB A1C W/O EAG: Hgb A1c MFr Bld: 11 % — ABNORMAL HIGH (ref 4.8–5.6)

## 2021-07-12 LAB — LIPID PANEL W/O CHOL/HDL RATIO
Cholesterol, Total: 175 mg/dL (ref 100–199)
HDL: 36 mg/dL — ABNORMAL LOW (ref >39)
LDL Chol Calc (NIH): 84 mg/dL (ref 0–99)
Triglycerides: 337 mg/dL — ABNORMAL HIGH (ref 0–149)
VLDL Cholesterol Cal: 55 mg/dL — ABNORMAL HIGH (ref 5–40)

## 2021-07-16 ENCOUNTER — Other Ambulatory Visit: Payer: Self-pay | Admitting: Internal Medicine

## 2021-10-13 ENCOUNTER — Encounter: Payer: Self-pay | Admitting: Internal Medicine

## 2021-10-13 ENCOUNTER — Ambulatory Visit: Payer: Self-pay | Admitting: Internal Medicine

## 2021-10-13 VITALS — BP 122/76 | HR 80 | Resp 16 | Ht 64.75 in | Wt 192.0 lb

## 2021-10-13 DIAGNOSIS — Z23 Encounter for immunization: Secondary | ICD-10-CM

## 2021-10-13 DIAGNOSIS — R5383 Other fatigue: Secondary | ICD-10-CM

## 2021-10-13 DIAGNOSIS — Z794 Long term (current) use of insulin: Secondary | ICD-10-CM

## 2021-10-13 DIAGNOSIS — R400 Somnolence: Secondary | ICD-10-CM

## 2021-10-13 DIAGNOSIS — R0683 Snoring: Secondary | ICD-10-CM

## 2021-10-13 DIAGNOSIS — E1165 Type 2 diabetes mellitus with hyperglycemia: Secondary | ICD-10-CM

## 2021-10-13 DIAGNOSIS — E785 Hyperlipidemia, unspecified: Secondary | ICD-10-CM

## 2021-10-13 DIAGNOSIS — E1142 Type 2 diabetes mellitus with diabetic polyneuropathy: Secondary | ICD-10-CM | POA: Insufficient documentation

## 2021-10-13 DIAGNOSIS — I10 Essential (primary) hypertension: Secondary | ICD-10-CM

## 2021-10-13 LAB — POCT GLUCOSE (DEVICE FOR HOME USE): Glucose Fasting, POC: 249 mg/dL — AB (ref 70–99)

## 2021-10-13 MED ORDER — LANTUS SOLOSTAR 100 UNIT/ML ~~LOC~~ SOPN: PEN_INJECTOR | SUBCUTANEOUS | 11 refills | Status: DC

## 2021-10-13 MED ORDER — GABAPENTIN 100 MG PO CAPS: ORAL_CAPSULE | ORAL | 6 refills | Status: DC

## 2021-10-13 MED ORDER — LOVASTATIN 40 MG PO TABS: ORAL_TABLET | ORAL | 3 refills | Status: DC

## 2021-10-13 NOTE — Progress Notes (Signed)
Subjective:    Patient ID: Thomas Ryan, male   DOB: [birth year], 61 y.o.   MRN: [id number]   HPI  Thomas Ryan interprets   Foot  pain/tingling/numbness:  Points to plantar arches of feet.  He stated his pain was improved after start of Gabapentin and titration to 300 mg at bedtime.  As he was still having pain on return home from work, we started a morning dose at 100 mg to titrate to 300 mg.  He remains on the 100 mg in the morning.  Cannot say why he did not increase.  Not walking in the morning as he feels he is too tired.    2.  DM:  He did not return call regarding his cholesterol and A1C from April.  Discussed he continues to remain high with his A1C, basically on average at 310 or higher.  States he never misses his meds.  Obtains Lantus from [facility].  He has been using 15 units of Lantus since visit in April.  He has no idea what his sugars run as he only checks sugars when not feeling well.   Has not had eye check.  His daughter is in town and plans to take him to [employer].    3.  Dyslipidemia:  LDL back up.  Not seeming to recognize changes he can make with lifestyle.  States taking lovastatin daily without miss.   4.  Fatigue:  Used to walk in the morning.  Having a hard time getting up in the morning in general.  No chest pain or dyspnea.  No LE edema.  No orthopnea or PND signs or symptoms.  Denies melena or hematochezia.  Feels he sleeps well at night.  His wife does not sleep in same room, but has said he snores.   Wife shares he often goes to bed at 3 a.m.   Wife also shares he snores when sleeping and at times pauses with breathing.  Current Meds  Medication Sig   acetaminophen (TYLENOL) 500 MG tablet Take 1 tablet (500 mg total) by mouth every 6 (six) hours as needed.   aspirin 81 MG tablet Take 81 mg by mouth daily.   gabapentin (NEURONTIN) 100 MG capsule 1 cap by mouth in the morning increase to 2 caps in the morning in 3 days and 3 caps by mouth in the  morning I another 3 days and remain on 300 mg   gabapentin (NEURONTIN) 300 MG capsule 1 cap by mouth daily at bedtime   glipiZIDE (GLUCOTROL) 10 MG tablet TAKE 1 TABLET BY MOUTH TWICE DAILY BEFORE A MEAL   insulin glargine (LANTUS SOLOSTAR) 100 UNIT/ML Solostar Pen INJECT 10 UNITS INTO THE SKIN EVERY MORNING BEFORE BREAKFAST.   lisinopril-hydrochlorothiazide (ZESTORETIC) 20-12.5 MG tablet TAKE 1 TABLET BY MOUTH IN THE MORNING   lovastatin (MEVACOR) 20 MG tablet 1 tab by mouth with evening meal   meloxicam (MOBIC) 15 MG tablet 1 tab by mouth with meal once daily for back pain as needed   metFORMIN (GLUCOPHAGE) 1000 MG tablet Take 1 tablet (1,000 mg total) by mouth 2 (two) times daily.      No Known Allergies   Review of Systems    Objective:   BP 122/76 (BP Location: Left Arm, Patient Position: Sitting, Cuff Size: Normal)   Pulse 80   Resp 16   Ht 5' 4.75" (1.645 m)   Wt 192 lb (87.1 kg)   BMI 32.20 kg/m   Physical Exam  NAD HEENT: PERRL, EOMI Neck:  Supple, No adenopathy, no thyromegaly Chest:  CTA CV:   RRR with normal S1 and S2, No S3, S4 or murmur.  Radial and DP pulses normal and equal Abd:  S, NT, No HSM or mass, + BS.  Obese LE:  No edema.  Diabetic foot exam was performed with the following findings:   No deformities, ulcerations, or other skin breakdown Normal sensation of 10g monofilament Intact posterior tibialis and dorsalis pedis pulses Skin on toes shiny.  Some formation of hammertoes.     Assessment & Plan    Diabetic peripheral neuropathy:  Repetitive discussion on titrating morning dose of gabapentin to 300 mg so taking 300 mg twice daily.  2.  DM:  increase Lantus to 20 units daily.  Checking A1C today.  Again, asking him to check glucose twice daily before meals until he gets this under better control  Has not had good control for at least a decade.  Significant issues with noncompliance with lifestyle and taking meds in past.  Have also tried to  address whether depressed with counselor, but did not seem to improve motivation to care for himself appropriately.  3.  Hyperlipidemia:  Increase Lovastatin to 40 mg daily.  Repeat FLP/hepatic profile in 6 weeks.  Call if muscle pain  4.  Fatigue/snoring/daytime somnolence:  likely OSA with history today, but also terrible sleep hygiene--watches TV until 3 a.m. often, then difficulty getting up at 8 a.m.  Split night sleep study.  Will need to apply for financial assistance with [facility]. Discussed better sleep hygiene.  TSH, CBC, CMP  5.  HM:  Moderna Bivalent 2nd booster of this as high risk of bad outcome if infected and Td today.    5.

## 2021-10-13 NOTE — Addendum Note (Signed)
Addended by: Duayne Cal on: 10/13/2021 10:14 AM   Modules accepted: Orders

## 2021-10-14 LAB — CBC WITH DIFFERENTIAL/PLATELET
Basophils Absolute: 0.1 10*3/uL (ref 0.0–0.2)
Basos: 1 %
EOS (ABSOLUTE): 0.8 10*3/uL — ABNORMAL HIGH (ref 0.0–0.4)
Eos: 8 %
Hematocrit: 44.9 % (ref 37.5–51.0)
Hemoglobin: 14.9 g/dL (ref 13.0–17.7)
Immature Grans (Abs): 0 10*3/uL (ref 0.0–0.1)
Immature Granulocytes: 0 %
Lymphocytes Absolute: 2 10*3/uL (ref 0.7–3.1)
Lymphs: 21 %
MCH: 27.6 pg (ref 26.6–33.0)
MCHC: 33.2 g/dL (ref 31.5–35.7)
MCV: 83 fL (ref 79–97)
Monocytes Absolute: 0.5 10*3/uL (ref 0.1–0.9)
Monocytes: 5 %
Neutrophils Absolute: 6.2 10*3/uL (ref 1.4–7.0)
Neutrophils: 65 %
Platelets: 219 10*3/uL (ref 150–450)
RBC: 5.4 x10E6/uL (ref 4.14–5.80)
RDW: 14.5 % (ref 11.6–15.4)
WBC: 9.6 10*3/uL (ref 3.4–10.8)

## 2021-10-14 LAB — COMPREHENSIVE METABOLIC PANEL
ALT: 24 IU/L (ref 0–44)
AST: 18 IU/L (ref 0–40)
Albumin/Globulin Ratio: 1.7 (ref 1.2–2.2)
Albumin: 4.3 g/dL (ref 3.9–4.9)
Alkaline Phosphatase: 122 IU/L — ABNORMAL HIGH (ref 44–121)
BUN/Creatinine Ratio: 16 (ref 10–24)
BUN: 16 mg/dL (ref 8–27)
Bilirubin Total: 0.3 mg/dL (ref 0.0–1.2)
CO2: 20 mmol/L (ref 20–29)
Calcium: 9.6 mg/dL (ref 8.6–10.2)
Chloride: 101 mmol/L (ref 96–106)
Creatinine, Ser: 0.98 mg/dL (ref 0.76–1.27)
Globulin, Total: 2.5 g/dL (ref 1.5–4.5)
Glucose: 291 mg/dL — ABNORMAL HIGH (ref 70–99)
Potassium: 4.5 mmol/L (ref 3.5–5.2)
Sodium: 137 mmol/L (ref 134–144)
Total Protein: 6.8 g/dL (ref 6.0–8.5)
eGFR: 88 mL/min/{1.73_m2} (ref >59)

## 2021-10-14 LAB — TSH: TSH: 1.17 u[IU]/mL (ref 0.450–4.500)

## 2021-10-14 LAB — HGB A1C W/O EAG: Hgb A1c MFr Bld: 11.1 % — ABNORMAL HIGH (ref 4.8–5.6)

## 2021-11-16 ENCOUNTER — Encounter (HOSPITAL_BASED_OUTPATIENT_CLINIC_OR_DEPARTMENT_OTHER): Payer: Self-pay | Admitting: Internal Medicine

## 2021-11-17 ENCOUNTER — Ambulatory Visit (HOSPITAL_BASED_OUTPATIENT_CLINIC_OR_DEPARTMENT_OTHER): Payer: Self-pay | Attending: Internal Medicine | Admitting: Internal Medicine

## 2021-11-17 VITALS — Ht 64.75 in | Wt 198.0 lb

## 2021-11-17 DIAGNOSIS — R400 Somnolence: Secondary | ICD-10-CM | POA: Insufficient documentation

## 2021-11-17 DIAGNOSIS — G4733 Obstructive sleep apnea (adult) (pediatric): Secondary | ICD-10-CM | POA: Insufficient documentation

## 2021-11-17 DIAGNOSIS — R0683 Snoring: Secondary | ICD-10-CM | POA: Insufficient documentation

## 2021-11-23 DIAGNOSIS — R0683 Snoring: Secondary | ICD-10-CM

## 2021-11-23 NOTE — Procedures (Signed)
    Patient Name: Thomas Ryan, Thomas Ryan Study Date: 11/17/2021 Gender: Male D.O.B: [birth year] Age (years): 61 Referring Provider: Julieanne Manson Height (inches): 65 Interpreting Physician: Jetty Duhamel MD, ABSM Weight (lbs): 198 RPSGT: Armen Pickup BMI: 33 MRN: [id number] Neck Size: 16.00  CLINICAL INFORMATION Sleep Study Type: NPSG Indication for sleep study: Diabetes, OSA, Snoring, Witnessed Apneas Epworth Sleepiness Score: 21  SLEEP STUDY TECHNIQUE As per the AASM Manual for the Scoring of Sleep and Associated Events v2.3 (April 2016) with a hypopnea requiring 4% desaturations.  The channels recorded and monitored were frontal, central and occipital EEG, electrooculogram (EOG), submentalis EMG (chin), nasal and oral airflow, thoracic and abdominal wall motion, anterior tibialis EMG, snore microphone, electrocardiogram, and pulse oximetry.  MEDICATIONS Medications self-administered by patient taken the night of the study : GABAPENTIN, Lantus Solostar, METFORMIN  SLEEP ARCHITECTURE The study was initiated at 9:49:28 PM and ended at 4:37:33 AM.  Sleep onset time was 18.4 minutes and the sleep efficiency was 72.5%%. The total sleep time was 296 minutes.  Stage REM latency was 181.0 minutes.  The patient spent 5.2%% of the night in stage N1 sleep, 72.6%% in stage N2 sleep, 0.0%% in stage N3 and 22.1% in REM.  Alpha intrusion was absent.  Supine sleep was 46.28%.  RESPIRATORY PARAMETERS The overall apnea/hypopnea index (AHI) was 13.4 per hour. There were 1 total apneas, including 1 obstructive, 0 central and 0 mixed apneas. There were 65 hypopneas and 26 RERAs.  The AHI during Stage REM sleep was 38.5 per hour.  AHI while supine was 20.6 per hour.  The mean oxygen saturation was 93.6%. The minimum SpO2 during sleep was 79.0%.  moderate snoring was noted during this study.  CARDIAC DATA The 2 lead EKG demonstrated sinus rhythm. The mean heart rate was 62.7  beats per minute. Other EKG findings include: None.  LEG MOVEMENT DATA The total PLMS were 0 with a resulting PLMS index of 0.0. Associated arousal with leg movement index was 10.1 .  IMPRESSIONS - Mild obstructive sleep apnea occurred during this study (AHI = 13.4/h). - Insufficient early sleep and events to meet protocol requirements for split CPAP titration. - Moderate oxygen desaturation was noted during this study (Min O2 = 79.0%). Mean 93.6% - The patient snored with moderate snoring volume. - No cardiac abnormalities were noted during this study. - Periodic Limb Movement. Total 469 (95.1/ hr). Limb Movement witharousal 50 (10.1/ hr).  DIAGNOSIS - Obstructive Sleep Apnea (G47.33) - Limb Movement Sleep Disorder   RECOMMENDATIONS - Suggest CPAP titration sleep study or autopap. Other options would be based on clinical judgment. - If limb movements remain clinical problem after treatment for apnea, consider trial of Requip or Mirapex. - Be careful with alcohol, sedatives and other CNS depressants that may worsen sleep apnea and disrupt normal sleep architecture. - Sleep hygiene should be reviewed to assess factors that may improve sleep quality. - Weight management and regular exercise should be initiated or continued if appropriate.  [Electronically signed] 11/23/2021 12:24 PM  Jetty Duhamel MD, ABSM Diplomate, American Board of Sleep Medicine NPI: [id number]                         Jetty Duhamel Diplomate, American Board of Sleep Medicine  ELECTRONICALLY SIGNED ON:  11/23/2021, 12:17 PM [facility] PH: [phone]   FX: [phone] ACCREDITED BY THE AMERICAN ACADEMY OF SLEEP MEDICINE

## 2021-12-12 IMAGING — DX DG CHEST 1V PORT
1 series · 1 of 1 positions shown · non-contrast
Comparison: February 06, 2019

CLINICAL DATA: Chest pain

EXAM:
PORTABLE CHEST 1 VIEW

[chest ap]
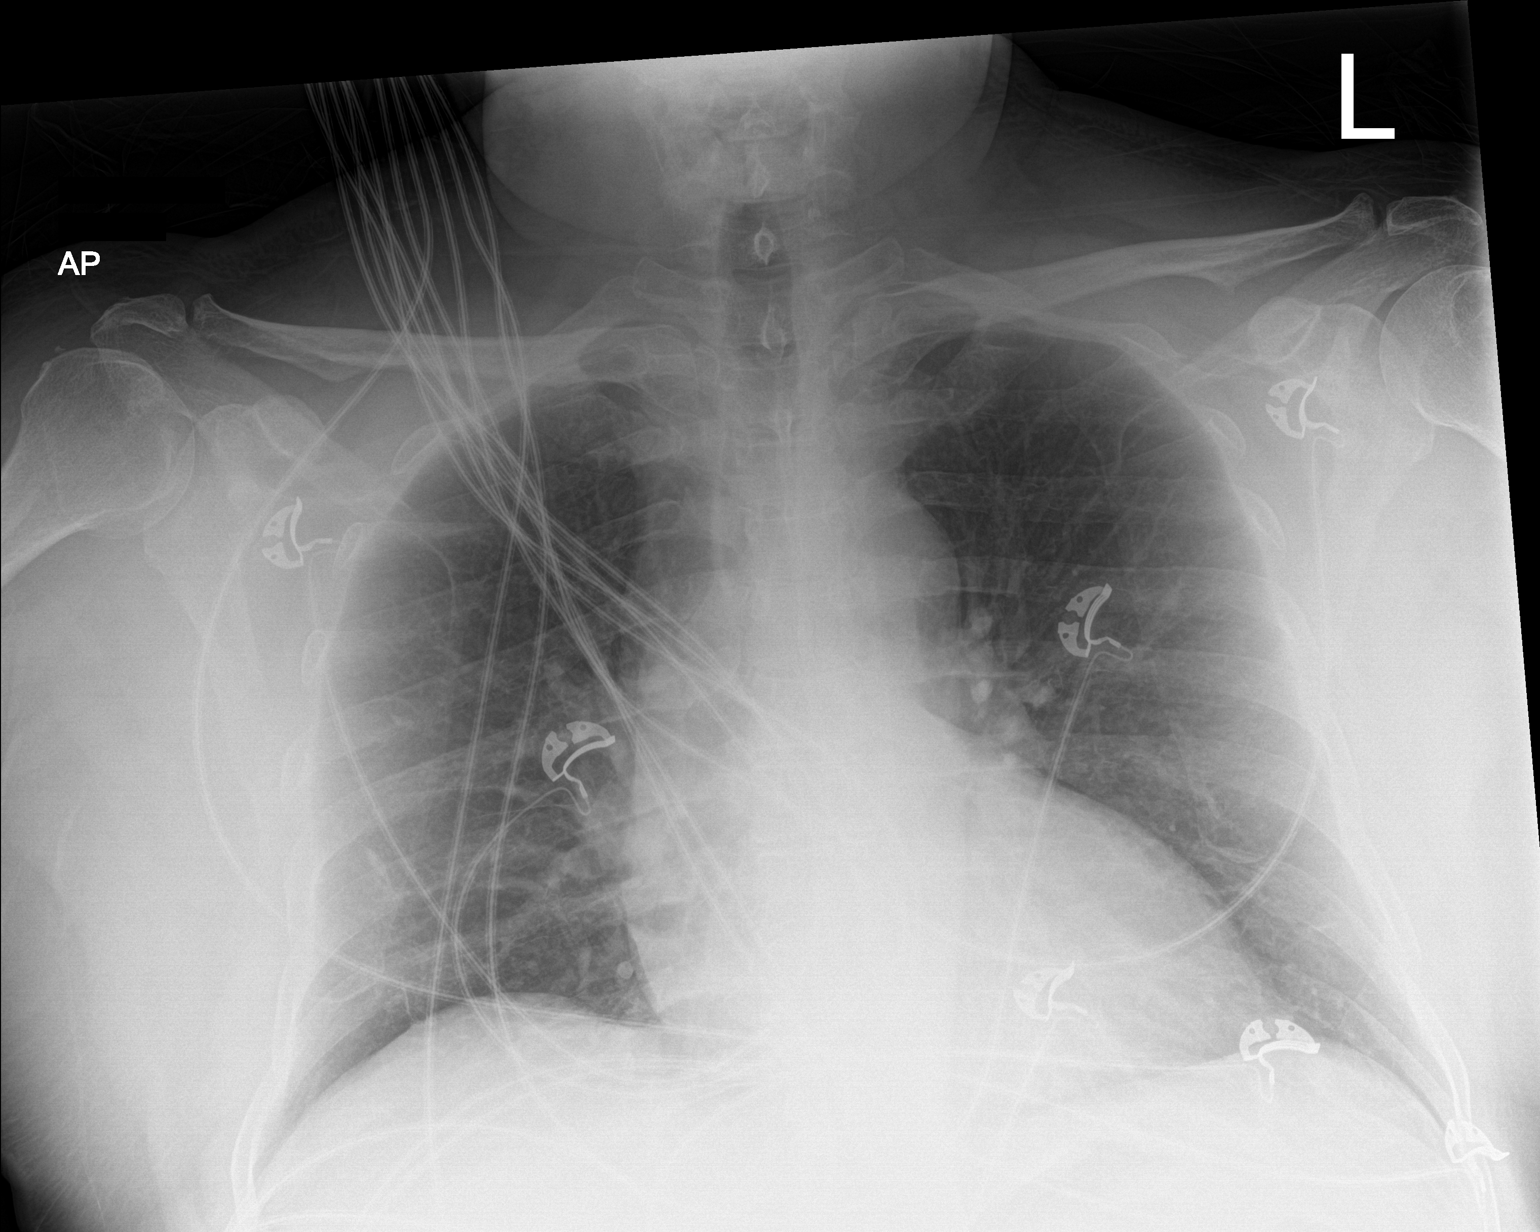

[1 of 1 positions shown; findings below may reference images not displayed]

FINDINGS: The heart size and mediastinal contours are upper limits of normal.
Both lungs are clear. The visualized skeletal structures are
unremarkable.
IMPRESSION: No active disease.

## 2022-02-14 ENCOUNTER — Other Ambulatory Visit: Payer: Self-pay | Admitting: Internal Medicine

## 2022-02-24 ENCOUNTER — Emergency Department (HOSPITAL_BASED_OUTPATIENT_CLINIC_OR_DEPARTMENT_OTHER): Payer: Self-pay

## 2022-02-24 ENCOUNTER — Other Ambulatory Visit (HOSPITAL_BASED_OUTPATIENT_CLINIC_OR_DEPARTMENT_OTHER): Payer: Self-pay

## 2022-02-24 ENCOUNTER — Emergency Department (HOSPITAL_BASED_OUTPATIENT_CLINIC_OR_DEPARTMENT_OTHER)
Admission: EM | Admit: 2022-02-24 | Discharge: 2022-02-24 | Disposition: A | Discharge status: Home / Self Care | Payer: Self-pay | Attending: Emergency Medicine | Admitting: Emergency Medicine

## 2022-02-24 ENCOUNTER — Encounter (HOSPITAL_BASED_OUTPATIENT_CLINIC_OR_DEPARTMENT_OTHER): Payer: Self-pay

## 2022-02-24 DIAGNOSIS — J40 Bronchitis, not specified as acute or chronic: Secondary | ICD-10-CM | POA: Insufficient documentation

## 2022-02-24 DIAGNOSIS — J069 Acute upper respiratory infection, unspecified: Secondary | ICD-10-CM | POA: Insufficient documentation

## 2022-02-24 DIAGNOSIS — I10 Essential (primary) hypertension: Secondary | ICD-10-CM | POA: Insufficient documentation

## 2022-02-24 DIAGNOSIS — Z1152 Encounter for screening for COVID-19: Secondary | ICD-10-CM | POA: Insufficient documentation

## 2022-02-24 DIAGNOSIS — Z7982 Long term (current) use of aspirin: Secondary | ICD-10-CM | POA: Insufficient documentation

## 2022-02-24 DIAGNOSIS — Z794 Long term (current) use of insulin: Secondary | ICD-10-CM | POA: Insufficient documentation

## 2022-02-24 DIAGNOSIS — Z7984 Long term (current) use of oral hypoglycemic drugs: Secondary | ICD-10-CM | POA: Insufficient documentation

## 2022-02-24 DIAGNOSIS — Z79899 Other long term (current) drug therapy: Secondary | ICD-10-CM | POA: Insufficient documentation

## 2022-02-24 DIAGNOSIS — E119 Type 2 diabetes mellitus without complications: Secondary | ICD-10-CM | POA: Insufficient documentation

## 2022-02-24 LAB — RESP PANEL BY RT-PCR (FLU A&B, COVID) ARPGX2
Influenza A by PCR: NEGATIVE
Influenza B by PCR: NEGATIVE
SARS Coronavirus 2 by RT PCR: NEGATIVE

## 2022-02-24 LAB — GROUP A STREP BY PCR: Group A Strep by PCR: NOT DETECTED

## 2022-02-24 MED ORDER — AZITHROMYCIN 250 MG PO TABS
250.0000 mg | ORAL_TABLET | Freq: Every day | ORAL | 0 refills | Status: DC
  Filled 2022-02-24: qty 6, 6d supply, fill #0

## 2022-02-24 MED ORDER — DM-GUAIFENESIN ER 30-600 MG PO TB12
1.0000 | ORAL_TABLET | Freq: Two times a day (BID) | ORAL | 1 refills | Status: DC
  Filled 2022-02-24: qty 20, 10d supply, fill #0

## 2022-02-24 NOTE — ED Provider Notes (Signed)
[facility] EMERGENCY DEPARTMENT Provider Note   CSN: [id number] Arrival date & time: 02/24/22  7124     History  Chief Complaint  Patient presents with   URI    Thomas Ryan is a 61 y.o. male.  Patient with about 15 days of coughing and chest discomfort when coughing also congestion and sore throat.  Family member in the room also is sick with similar type illness.  They are here because they are wondering why not getting better.  Past medical history significant for hypertension hyperlipidemia and type 2 diabetes.  Patient never used tobacco products.  Oxygen sats here 98% temp 98.4 pulse 85 blood pressure 146/80 7 repeat was 129/83.  Family member used as Equities trader via phone.        Home Medications Prior to Admission medications   Medication Sig Start Date End Date Taking? Authorizing Provider  azithromycin (ZITHROMAX) 250 MG tablet Take first 2 tablets by mouth together, then take 1 tablet every day until finished. 02/24/22  Yes Vanetta Mulders, MD  dextromethorphan-guaiFENesin [facility] DM) 30-600 MG 12hr tablet Take 1 tablet by mouth 2 (two) times daily. 02/24/22  Yes Vanetta Mulders, MD  acetaminophen (TYLENOL) 500 MG tablet Take 1 tablet (500 mg total) by mouth every 6 (six) hours as needed. 10/08/19   Lorelee New, PA-C  aspirin 81 MG tablet Take 81 mg by mouth daily.    [provider]  gabapentin (NEURONTIN) 100 MG capsule 2 caps by mouth in the morning and increase to 3 caps in the morning in 3 days. 10/13/21   Julieanne Manson, MD  gabapentin (NEURONTIN) 300 MG capsule 1 cap by mouth daily at bedtime 03/20/21   Julieanne Manson, MD  glipiZIDE (GLUCOTROL) 10 MG tablet TAKE 1 TABLET BY MOUTH TWICE DAILY BEFORE A MEAL 07/31/20   Julieanne Manson, MD  insulin glargine (LANTUS SOLOSTAR) 100 UNIT/ML Solostar Pen INJECT 20 UNITS INTO THE SKIN EVERY MORNING BEFORE BREAKFAST. 10/13/21   Julieanne Manson, MD   lisinopril-hydrochlorothiazide (ZESTORETIC) 20-12.5 MG tablet TAKE 1 TABLET BY MOUTH ONCE DAILY IN THE MORNING 02/17/22   Julieanne Manson, MD  lovastatin (MEVACOR) 40 MG tablet 1 tab by mouth with evening meal 10/13/21   Julieanne Manson, MD  meloxicam (MOBIC) 15 MG tablet 1 tab by mouth with meal once daily for back pain as needed 07/10/21   Julieanne Manson, MD  metFORMIN (GLUCOPHAGE) 1000 MG tablet Take 1 tablet (1,000 mg total) by mouth 2 (two) times daily. 03/20/21   Julieanne Manson, MD      Allergies    Patient has no known allergies.    Review of Systems   Review of Systems  Constitutional:  Negative for chills and fever.  HENT:  Positive for congestion and sore throat. Negative for ear pain.   Eyes:  Negative for pain and visual disturbance.  Respiratory:  Positive for cough. Negative for shortness of breath.   Cardiovascular:  Positive for chest pain. Negative for palpitations.  Gastrointestinal:  Negative for abdominal pain and vomiting.  Genitourinary:  Negative for dysuria and hematuria.  Musculoskeletal:  Negative for arthralgias and back pain.  Skin:  Negative for color change and rash.  Neurological:  Negative for seizures and syncope.  All other systems reviewed and are negative.   Physical Exam Updated Vital Signs BP 129/83   Pulse 85   Temp 98.4 F (36.9 C) (Oral)   Resp 16   Ht 1.645 m (5' 4.75")   Wt 87.3 kg  SpO2 98%   BMI 32.26 kg/m  Physical Exam Vitals and nursing note reviewed.  Constitutional:      General: He is not in acute distress.    Appearance: Normal appearance. He is well-developed.  HENT:     Head: Normocephalic and atraumatic.  Eyes:     Extraocular Movements: Extraocular movements intact.     Conjunctiva/sclera: Conjunctivae normal.     Pupils: Pupils are equal, round, and reactive to light.  Cardiovascular:     Rate and Rhythm: Normal rate and regular rhythm.     Heart sounds: No murmur heard. Pulmonary:      Effort: Pulmonary effort is normal. No respiratory distress.     Breath sounds: Normal breath sounds. No wheezing, rhonchi or rales.  Chest:     Chest wall: No tenderness.  Abdominal:     Palpations: Abdomen is soft.     Tenderness: There is no abdominal tenderness.  Musculoskeletal:        General: No swelling.     Cervical back: Normal range of motion and neck supple. No rigidity.  Skin:    General: Skin is warm and dry.     Capillary Refill: Capillary refill takes less than 2 seconds.  Neurological:     General: No focal deficit present.     Mental Status: He is alert and oriented to person, place, and time.     Cranial Nerves: No cranial nerve deficit.     Sensory: No sensory deficit.     Motor: No weakness.  Psychiatric:        Mood and Affect: Mood normal.     ED Results / Procedures / Treatments   Labs (all labs ordered are listed, but only abnormal results are displayed) Labs Reviewed  GROUP A STREP BY PCR  RESP PANEL BY RT-PCR (FLU A&B, COVID) [id number]    EKG None  Radiology CT Chest Wo Contrast  Result Date: 02/24/2022 CLINICAL DATA:  Pneumonia, sore throat, cough, congestion, and generalized chest pain with coughing for 15 days, family member also coughing and sick EXAM: CT CHEST WITHOUT CONTRAST TECHNIQUE: Multidetector CT imaging of the chest was performed following the standard protocol without IV contrast. RADIATION DOSE REDUCTION: This exam was performed according to the departmental dose-optimization program which includes automated exposure control, adjustment of the mA and/or kV according to patient size and/or use of iterative reconstruction technique. COMPARISON:  06/18/2005 FINDINGS: Cardiovascular: Aorta normal caliber. Scattered atherosclerotic calcifications aorta. Heart size normal. No pericardial effusion. Mediastinum/Nodes: Base of cervical region unremarkable. No thoracic adenopathy, though hilar assessment for lymph nodes is limited by lack of IV  contrast. Esophagus unremarkable. Lungs/Pleura: Mild scattered peribronchial thickening. Few respiratory motion artifacts. Minimal atelectasis at base of lingula. No definite infiltrate, pleural effusion, or pneumothorax. Upper Abdomen: Visualized upper abdomen unremarkable. Musculoskeletal: Osseous structures normal appearance. IMPRESSION: Mild scattered peribronchial thickening question bronchitis. Minimal atelectasis at base of lingula. No definite infiltrate, pleural effusion, or pneumothorax. Aortic Atherosclerosis (ICD10-I70.0). Electronically Signed   By: Ulyses Southward M.D.   On: 02/24/2022 08:46   DG Chest 2 View  Result Date: 02/24/2022 CLINICAL DATA:  Cough, sore throat EXAM: CHEST - 2 VIEW COMPARISON:  Radiograph 02/27/2020, CT 06/17/2005 FINDINGS: Normal cardiac silhouette. Mild central venous congestion. No effusion, infiltrate pneumothorax. On lateral projection there is a rounded 1.5 cm density projecting over the anterior margin of the lower thoracic spine. This is not placed on comparison CT from 2007. IMPRESSION: 1. Mild central venous congestion. 2. Lower  lobe density projecting over the spine on lateral projection. Finding may represent osteophytosis however no degenerative disc disease on comparison CT. Recommend CT thorax for further evaluation Electronically Signed   By: Genevive Bi M.D.   On: 02/24/2022 08:02    Procedures Procedures    Medications Ordered in ED Medications - No data to display  ED Course/ Medical Decision Making/ A&P                           Medical Decision Making Amount and/or Complexity of Data Reviewed Radiology: ordered.  Risk OTC drugs. Prescription drug management.   Family member health provider.  Was able to interpret via phone for them.  Work-up here chest x-ray some concerns about an opacity or perhaps lesion.  CT chest was done but that was negative.  Patient's vital signs here are very reassuring.  COVID influenza testing negative.   Group A strep negative.  Symptoms seem to be consistent with probable viral upper respiratory infection may be bronchitis.  Discussion with family member did not want to go with steroids due to the history of diabetes we decided to go with a trial of Z-Pak and Mucinex DM.  They will pick up the prescriptions here.  Patient nontoxic no acute distress.  Results very reassuring. Final Clinical Impression(s) / ED Diagnoses Final diagnoses:  Viral upper respiratory tract infection  Bronchitis    Rx / DC Orders ED Discharge Orders          Ordered    azithromycin (ZITHROMAX) 250 MG tablet  Daily        02/24/22 0941    dextromethorphan-guaiFENesin (MUCINEX DM) 30-600 MG 12hr tablet  2 times daily        02/24/22 0941              Vanetta Mulders, MD 02/24/22 0945

## 2022-02-24 NOTE — ED Triage Notes (Addendum)
C/o sore throat, cough, congestion, generalized chest pain worse with coughing x 15 days. Family member in room is also coughing/sick.  Spanish interpreter [place] # [phone] used during triage.

## 2022-02-24 NOTE — Discharge Instructions (Signed)
Take the antibiotic Zithromycin as directed.  We will give this a trial to see if it helps you feel better.  Also take the Mucinex DM as needed for cough.  CT chest without evidence of pneumonia.  COVID and influenza testing negative.  Make an appointment to follow-up with your doctor.

## 2022-04-19 ENCOUNTER — Other Ambulatory Visit: Payer: Self-pay | Admitting: Internal Medicine

## 2022-05-16 ENCOUNTER — Other Ambulatory Visit: Payer: Self-pay | Admitting: Internal Medicine

## 2022-10-27 ENCOUNTER — Other Ambulatory Visit: Payer: Self-pay | Admitting: Internal Medicine

## 2023-05-16 ENCOUNTER — Other Ambulatory Visit: Payer: Self-pay | Admitting: Internal Medicine

## 2023-06-03 ENCOUNTER — Other Ambulatory Visit: Payer: Self-pay

## 2023-06-06 ENCOUNTER — Other Ambulatory Visit: Payer: Self-pay

## 2023-06-06 DIAGNOSIS — E785 Hyperlipidemia, unspecified: Secondary | ICD-10-CM

## 2023-06-06 DIAGNOSIS — R748 Abnormal levels of other serum enzymes: Secondary | ICD-10-CM

## 2023-06-07 ENCOUNTER — Encounter: Payer: Self-pay | Admitting: Internal Medicine

## 2023-06-07 ENCOUNTER — Ambulatory Visit: Payer: Self-pay | Admitting: Internal Medicine

## 2023-06-07 VITALS — BP 160/84 | HR 83 | Resp 16 | Ht 64.5 in | Wt 175.0 lb

## 2023-06-07 DIAGNOSIS — Z794 Long term (current) use of insulin: Secondary | ICD-10-CM

## 2023-06-07 DIAGNOSIS — I10 Essential (primary) hypertension: Secondary | ICD-10-CM

## 2023-06-07 DIAGNOSIS — E1165 Type 2 diabetes mellitus with hyperglycemia: Secondary | ICD-10-CM

## 2023-06-07 DIAGNOSIS — Z23 Encounter for immunization: Secondary | ICD-10-CM

## 2023-06-07 DIAGNOSIS — E11319 Type 2 diabetes mellitus with unspecified diabetic retinopathy without macular edema: Secondary | ICD-10-CM

## 2023-06-07 DIAGNOSIS — E1142 Type 2 diabetes mellitus with diabetic polyneuropathy: Secondary | ICD-10-CM

## 2023-06-07 DIAGNOSIS — E785 Hyperlipidemia, unspecified: Secondary | ICD-10-CM

## 2023-06-07 LAB — LIPID PANEL W/O CHOL/HDL RATIO
Cholesterol, Total: 182 mg/dL (ref 100–199)
HDL: 41 mg/dL (ref >39)
LDL Chol Calc (NIH): 118 mg/dL — ABNORMAL HIGH (ref 0–99)
Triglycerides: 130 mg/dL (ref 0–149)
VLDL Cholesterol Cal: 23 mg/dL (ref 5–40)

## 2023-06-07 LAB — GLUCOSE, POCT (MANUAL RESULT ENTRY): POC Glucose: 329 mg/dL — AB (ref 70–99)

## 2023-06-07 MED ORDER — LANTUS SOLOSTAR 100 UNIT/ML ~~LOC~~ SOPN: PEN_INJECTOR | SUBCUTANEOUS | 1 refills | Status: DC

## 2023-06-07 MED ORDER — LOVASTATIN 40 MG PO TABS: ORAL_TABLET | ORAL | 3 refills | Status: AC

## 2023-06-07 MED ORDER — GABAPENTIN 300 MG PO CAPS: ORAL_CAPSULE | ORAL | 3 refills | Status: AC

## 2023-06-07 MED ORDER — GABAPENTIN 100 MG PO CAPS: ORAL_CAPSULE | ORAL | 0 refills | Status: DC

## 2023-06-07 MED ORDER — METFORMIN HCL 1000 MG PO TABS: 1000.0000 mg | ORAL_TABLET | Freq: Two times a day (BID) | ORAL | 1 refills | Status: DC

## 2023-06-07 MED ORDER — LISINOPRIL-HYDROCHLOROTHIAZIDE 20-12.5 MG PO TABS: 1.0000 | ORAL_TABLET | Freq: Every morning | ORAL | 2 refills | Status: AC

## 2023-06-07 NOTE — Progress Notes (Signed)
 Subjective:    Patient ID: Thomas Ryan, male   DOB: 12-10-1960, 63 y.o.   MRN: [id number]   HPI  Has not been seen for almost 2 years.    Duayne Cal interprets.    DM:  states he is still getting his Lantus from [facility] pharmacy.  Not clear how long he was off his meds as not written until he made an appt.  States his vision is getting worse.    2.  Hypertension:  off meds  3.  Dyslipidemia:  LDL too high from yesterday's labs.  CMP pending.   Lipid Panel     Component Value Date/Time   CHOL 182 06/06/2023 0835   TRIG 130 06/06/2023 0835   HDL 41 06/06/2023 0835   CHOLHDL 4.2 02/06/2016 0915   CHOLHDL 5.1 04/18/2012 1109   VLDL 71 (H) 04/18/2012 1109   LDLCALC 118 (H) 06/06/2023 0835   LABVLDL 23 06/06/2023 0835     Current Meds  Medication Sig   insulin glargine (LANTUS SOLOSTAR) 100 UNIT/ML Solostar Pen INJECT 20 UNITS INTO THE SKIN EVERY MORNING BEFORE BREAKFAST. (Patient taking differently: INJECT 15 UNITS INTO THE SKIN EVERY MORNING BEFORE BREAKFAST.)   No Known Allergies   Review of Systems    Objective:   BP (!) 160/84 (BP Location: Left Arm, Patient Position: Sitting, Cuff Size: Normal)   Pulse 83   Resp 16   Ht 5' 4.5" (1.638 m)   Wt 175 lb (79.4 kg)   BMI 29.57 kg/m   Physical Exam HENT:     Head: Normocephalic and atraumatic.     Right Ear: Tympanic membrane, ear canal and external ear normal.     Left Ear: Tympanic membrane, ear canal and external ear normal.     Mouth/Throat:     Mouth: Mucous membranes are moist.     Pharynx: Oropharynx is clear.  Eyes:     Extraocular Movements: Extraocular movements intact.     Conjunctiva/sclera: Conjunctivae normal.     Pupils: Pupils are equal, round, and reactive to light.     Comments: Discs appear sharp  Neck:     Thyroid: No thyromegaly.  Cardiovascular:     Rate and Rhythm: Normal rate and regular rhythm.     Heart sounds: S1 normal and S2 normal. No murmur heard.    No  friction rub. No S3 or S4 sounds.     Comments: No carotid bruits.  Carotid, radial, femoral, DP and PT pulses normal and equal.   Pulmonary:     Effort: Pulmonary effort is normal.     Breath sounds: Normal breath sounds and air entry.  Musculoskeletal:     Cervical back: Normal range of motion and neck supple.     Right lower leg: No edema.     Left lower leg: No edema.  Lymphadenopathy:     Head:     Right side of head: No submental or submandibular adenopathy.     Left side of head: No submental or submandibular adenopathy.     Cervical: No cervical adenopathy.  Neurological:     Mental Status: He is alert.       Assessment & Plan   Poor compliance  2.  DM: restart meds.  Need to see he is following up before considering change in meds.  Would ultimately prefer to move to Jardiance and Ozempic and wean other DM meds:  Lantus, glipizide, Metformin.Marland Kitchen  Refill gabapentin for neuropathty.  A1C, urine  microalbumin/crea.  CBC  3.  Hypertension:  not controlled.  Lisinopril/hydrochlorothiazide filled  4   Dyslipidemia:  restart Lovastatin  5.  HM:  pneumococcal 20 conjugate.  Needs yearly influenza and COvID in future.

## 2023-06-08 LAB — CBC WITH DIFFERENTIAL/PLATELET
Basophils Absolute: 0.1 10*3/uL (ref 0.0–0.2)
Basos: 1 %
EOS (ABSOLUTE): 0.7 10*3/uL — ABNORMAL HIGH (ref 0.0–0.4)
Eos: 8 %
Hematocrit: 49.4 % (ref 37.5–51.0)
Hemoglobin: 16.2 g/dL (ref 13.0–17.7)
Immature Grans (Abs): 0 10*3/uL (ref 0.0–0.1)
Immature Granulocytes: 0 %
Lymphocytes Absolute: 2.6 10*3/uL (ref 0.7–3.1)
Lymphs: 29 %
MCH: 27.2 pg (ref 26.6–33.0)
MCHC: 32.8 g/dL (ref 31.5–35.7)
MCV: 83 fL (ref 79–97)
Monocytes Absolute: 0.5 10*3/uL (ref 0.1–0.9)
Monocytes: 5 %
Neutrophils Absolute: 5.1 10*3/uL (ref 1.4–7.0)
Neutrophils: 57 %
Platelets: 253 10*3/uL (ref 150–450)
RBC: 5.95 x10E6/uL — ABNORMAL HIGH (ref 4.14–5.80)
RDW: 13.7 % (ref 11.6–15.4)
WBC: 8.9 10*3/uL (ref 3.4–10.8)

## 2023-06-08 LAB — HEPATIC FUNCTION PANEL
ALT: 30 IU/L (ref 0–44)
AST: 22 IU/L (ref 0–40)
Albumin: 4 g/dL (ref 3.9–4.9)
Alkaline Phosphatase: 141 IU/L — ABNORMAL HIGH (ref 44–121)
Bilirubin Total: 0.6 mg/dL (ref 0.0–1.2)
Bilirubin, Direct: 0.19 mg/dL (ref 0.00–0.40)
Total Protein: 6.7 g/dL (ref 6.0–8.5)

## 2023-06-08 LAB — SPECIMEN STATUS REPORT

## 2023-06-08 LAB — MICROALBUMIN / CREATININE URINE RATIO
Creatinine, Urine: 49.1 mg/dL
Microalb/Creat Ratio: 19 mg/g{creat} (ref 0–29)
Microalbumin, Urine: 9.5 ug/mL

## 2023-06-08 LAB — HGB A1C W/O EAG: Hgb A1c MFr Bld: 13.4 % — ABNORMAL HIGH (ref 4.8–5.6)

## 2023-07-04 ENCOUNTER — Other Ambulatory Visit: Payer: Self-pay | Admitting: Internal Medicine

## 2023-10-07 ENCOUNTER — Other Ambulatory Visit: Payer: Self-pay

## 2023-10-07 DIAGNOSIS — E785 Hyperlipidemia, unspecified: Secondary | ICD-10-CM

## 2023-10-07 DIAGNOSIS — Z794 Long term (current) use of insulin: Secondary | ICD-10-CM

## 2023-10-08 LAB — COMPREHENSIVE METABOLIC PANEL WITH GFR
ALT: 20 IU/L (ref 0–44)
AST: 18 IU/L (ref 0–40)
Albumin: 4.2 g/dL (ref 3.9–4.9)
Alkaline Phosphatase: 139 IU/L — ABNORMAL HIGH (ref 44–121)
BUN/Creatinine Ratio: 22 (ref 10–24)
BUN: 23 mg/dL (ref 8–27)
Bilirubin Total: 0.4 mg/dL (ref 0.0–1.2)
CO2: 22 mmol/L (ref 20–29)
Calcium: 9.3 mg/dL (ref 8.6–10.2)
Chloride: 100 mmol/L (ref 96–106)
Creatinine, Ser: 1.04 mg/dL (ref 0.76–1.27)
Globulin, Total: 2.7 g/dL (ref 1.5–4.5)
Glucose: 293 mg/dL — ABNORMAL HIGH (ref 70–99)
Potassium: 4.6 mmol/L (ref 3.5–5.2)
Sodium: 136 mmol/L (ref 134–144)
Total Protein: 6.9 g/dL (ref 6.0–8.5)
eGFR: 81 mL/min/1.73 (ref >59)

## 2023-10-08 LAB — LIPID PANEL W/O CHOL/HDL RATIO
Cholesterol, Total: 169 mg/dL (ref 100–199)
HDL: 34 mg/dL — ABNORMAL LOW (ref >39)
LDL Chol Calc (NIH): 66 mg/dL (ref 0–99)
Triglycerides: 442 mg/dL — ABNORMAL HIGH (ref 0–149)
VLDL Cholesterol Cal: 69 mg/dL — ABNORMAL HIGH (ref 5–40)

## 2023-10-08 LAB — HEMOGLOBIN A1C
Est. average glucose Bld gHb Est-mCnc: 306 mg/dL
Hgb A1c MFr Bld: 12.3 % — ABNORMAL HIGH (ref 4.8–5.6)

## 2023-10-12 ENCOUNTER — Ambulatory Visit: Payer: Self-pay | Admitting: Internal Medicine

## 2023-10-12 ENCOUNTER — Encounter: Payer: Self-pay | Admitting: Internal Medicine

## 2023-10-12 VITALS — BP 118/65 | HR 94 | Resp 17 | Ht 64.0 in | Wt 181.0 lb

## 2023-10-12 DIAGNOSIS — Z794 Long term (current) use of insulin: Secondary | ICD-10-CM

## 2023-10-12 DIAGNOSIS — H547 Unspecified visual loss: Secondary | ICD-10-CM

## 2023-10-12 DIAGNOSIS — E1165 Type 2 diabetes mellitus with hyperglycemia: Secondary | ICD-10-CM

## 2023-10-12 DIAGNOSIS — I10 Essential (primary) hypertension: Secondary | ICD-10-CM

## 2023-10-12 DIAGNOSIS — E785 Hyperlipidemia, unspecified: Secondary | ICD-10-CM

## 2023-10-12 DIAGNOSIS — E1142 Type 2 diabetes mellitus with diabetic polyneuropathy: Secondary | ICD-10-CM

## 2023-10-12 MED ORDER — OZEMPIC (0.25 OR 0.5 MG/DOSE) 2 MG/3ML ~~LOC~~ SOPN: PEN_INJECTOR | SUBCUTANEOUS | 11 refills | Status: DC

## 2023-10-12 MED ORDER — LANCETS MISC. MISC: 11 refills | Status: AC

## 2023-10-12 MED ORDER — LANTUS SOLOSTAR 100 UNIT/ML ~~LOC~~ SOPN: 15.0000 [IU] | PEN_INJECTOR | Freq: Two times a day (BID) | SUBCUTANEOUS | 11 refills | Status: AC

## 2023-10-12 MED ORDER — BLOOD GLUCOSE MONITORING SUPPL DEVI: 0 refills | Status: AC

## 2023-10-12 MED ORDER — BLOOD GLUCOSE TEST VI STRP: ORAL_STRIP | 11 refills | Status: AC

## 2023-10-12 MED ORDER — LANCET DEVICE MISC: 0 refills | Status: AC

## 2023-10-12 MED ORDER — EMPAGLIFLOZIN 10 MG PO TABS: 10.0000 mg | ORAL_TABLET | Freq: Every day | ORAL | 11 refills | Status: AC

## 2023-10-12 MED ORDER — METFORMIN HCL 1000 MG PO TABS: 1000.0000 mg | ORAL_TABLET | Freq: Two times a day (BID) | ORAL | 1 refills | Status: AC

## 2023-10-12 MED ORDER — FISH OIL 1000 MG PO CAPS: ORAL_CAPSULE | ORAL | Status: AC

## 2023-10-12 NOTE — Patient Instructions (Signed)
 Call when you have your Ozempic  for follow up in 2 weeks.  Call in September to find out about influenza and COvID vaccines.

## 2023-10-12 NOTE — Progress Notes (Signed)
 Subjective:    Patient ID: Thomas Ryan, male   DOB: 1960-08-12, 63 y.o.   MRN: [id number]   HPI  Thomas Ryan interprets   DM:  A1C remains poor at 12.3%.  Is not taking most of his meds as written.  He does not look at directions on bottle.  States he is taking meds regularly for past 3 months.  History of noncompliance.   2.  Dyslipidemia:  Total and LDL at goal, but HDL low and trigs too high.  States he takes statin daily.  Lipid Panel     Component Value Date/Time   CHOL 169 10/07/2023 0919   TRIG 442 (H) 10/07/2023 0919   HDL 34 (L) 10/07/2023 0919   CHOLHDL 4.2 02/06/2016 0915   CHOLHDL 5.1 04/18/2012 1109   VLDL 71 (H) 04/18/2012 1109   LDLCALC 66 10/07/2023 0919   LABVLDL 69 (H) 10/07/2023 0919   3.  Hypertension:  may be taking Lisinopril /hydrochlorothiazide  twice daily.   4.  Diabetic peripheral neuropathy:  States not having numbness or tingling in LE, but does state he has burning and throbbing pain in legs when sugars are high.  Based on A1C, on average, running sugar of about 350.    5.  Decreased visual acuity in right eye.  Unable to keep his job working in [employer] as could not read the orders.  States a problem for maybe 5 months.    Current Meds  Medication Sig   acetaminophen  (TYLENOL ) 500 MG tablet Take 1 tablet (500 mg total) by mouth every 6 (six) hours as needed.   aspirin 81 MG tablet Take 81 mg by mouth daily.   gabapentin  (NEURONTIN ) 100 MG capsule TAKE 2 ONCE DAILY IN THE MORNING AND INCREASE TO 3 CAPSULES BY MOUTH ONCE DAILY IN THE MORNING IN 3 DAYS (Patient taking differently: 100 mg 3 (three) times daily. TAKE 2 ONCE DAILY IN THE MORNING AND INCREASE TO 3 CAPSULES BY MOUTH ONCE DAILY IN THE MORNING IN 3 DAYS)   insulin  glargine (LANTUS  SOLOSTAR) 100 UNIT/ML Solostar Pen INJECT 20 UNITS INTO THE SKIN EVERY MORNING BEFORE BREAKFAST. (Patient taking differently: Inject 15 Units into the skin 2 (two) times daily. INJECT 20 UNITS INTO  THE SKIN EVERY MORNING BEFORE BREAKFAST.)   lisinopril -hydrochlorothiazide  (ZESTORETIC ) 20-12.5 MG tablet Take 1 tablet by mouth every morning. (Patient taking differently: Take 1 tablet by mouth every morning. 1 tab by mouth twice daily.)   lovastatin  (MEVACOR ) 40 MG tablet 1 tab by mouth with evening meal   meloxicam  (MOBIC ) 15 MG tablet 1 tab by mouth with meal once daily for back pain as needed   metFORMIN  (GLUCOPHAGE ) 1000 MG tablet Take 1 tablet (1,000 mg total) by mouth 2 (two) times daily.   No Known Allergies   Review of Systems    Objective:   BP 118/65 (BP Location: Left Arm, Patient Position: Sitting, Cuff Size: Normal)   Pulse 94   Resp 17   Ht 5' 4 (1.626 m)   Wt 181 lb (82.1 kg)   BMI 31.07 kg/m   Physical Exam Constitutional:      Appearance: He is obese.  HENT:     Head: Normocephalic and atraumatic.     Right Ear: Tympanic membrane, ear canal and external ear normal.     Left Ear: Tympanic membrane, ear canal and external ear normal.     Nose: Nose normal.     Mouth/Throat:     Mouth: Mucous  membranes are moist.     Pharynx: Oropharynx is clear.  Eyes:     Extraocular Movements: Extraocular movements intact.     Conjunctiva/sclera: Conjunctivae normal.     Pupils: Pupils are equal, round, and reactive to light.  Cardiovascular:     Rate and Rhythm: Normal rate and regular rhythm.     Pulses:          Dorsalis pedis pulses are 2+ on the right side and 2+ on the left side.       Posterior tibial pulses are 2+ on the right side and 2+ on the left side.     Heart sounds: S1 normal and S2 normal. No murmur heard.    No friction rub. No S3 or S4 sounds.     Comments: No carotid bruits.  Carotid, radial, femoral, DP and PT pulses normal and equal.   Pulmonary:     Effort: Pulmonary effort is normal.     Breath sounds: Normal breath sounds.  Abdominal:     General: Bowel sounds are normal.     Palpations: Abdomen is soft. There is no mass.      Tenderness: There is no abdominal tenderness.     Hernia: No hernia is present.  Musculoskeletal:     Cervical back: Normal range of motion and neck supple.     Right lower leg: No edema.     Left lower leg: No edema.  Feet:     Right foot:     Protective Sensation: 10 sites tested.  5 sites sensed.     Skin integrity: Skin integrity normal.     Left foot:     Protective Sensation: 10 sites tested.  10 sites sensed.     Skin integrity: Skin integrity normal.  Lymphadenopathy:     Head:     Right side of head: No submental or submandibular adenopathy.     Left side of head: No submental or submandibular adenopathy.     Cervical: No cervical adenopathy.  Skin:    General: Skin is warm.     Capillary Refill: Capillary refill takes less than 2 seconds.  Neurological:     General: No focal deficit present.     Mental Status: He is alert and oriented to person, place, and time.      Assessment & Plan  DM:  Poor control.  Went over applying for Ozempic  and Jardiance  at [facility].  Current meds refilled.  Call office for every 2 week follow up once has Ozempic .  Will start with wean of insulin  should sugars respond well.    2.  Dyslipidemia:  Add omega 3 FA with low HDL and high trigs.  3.  Decreased right visual acuity:  referral to eye.  Voucher for corrective lenses if needed.    4.  Reported peripheral neuropathy only when he feels his sugars are high.    5.  Hypertension:  controlled.

## 2023-11-18 ENCOUNTER — Telehealth: Payer: Self-pay | Admitting: Internal Medicine

## 2023-11-25 NOTE — Telephone Encounter (Signed)
 Patient has received Ozempic  and Jardiance  two weeks ago from [facility].

## 2023-11-25 NOTE — Telephone Encounter (Signed)
 Spoke with wife  Patient has been scheduled for 12/09/23 due to patient's availability and patient has only been checking sugars once a week.  Notified patient's wife to inform patient he is to check his sugars twice a day before breakfast and before diner.  Wife agree , wife states patient has only had loss of appetite since start of ozempic .

## 2023-11-25 NOTE — Telephone Encounter (Signed)
 Left voicemail asking to return call 11/25/23.

## 2023-12-09 ENCOUNTER — Other Ambulatory Visit: Payer: Self-pay

## 2023-12-09 NOTE — Progress Notes (Signed)
 Patient has no change in weight from last visit,Current weight is 181lbs Injection day Sunday night. Dose amount .25 Started injected on 11/20/23. Did not start checking glucose levels until 9/25 due funding to purchase the meter.

## 2023-12-23 ENCOUNTER — Other Ambulatory Visit: Payer: Self-pay

## 2023-12-23 NOTE — Progress Notes (Signed)
 Patient previous weight 181lbs ,No change in weight .Injection day Sunday night. Dose amount .25 Started injected on 11/20/23. Other medication Metformin  1000 Jardiance  10mg . Patient reports no issues with medication.Sugars am 106 lowest -pm 200

## 2023-12-23 NOTE — Progress Notes (Unsigned)
 Increase Ozempic  to 0.5 mg weekly. Follow up in 2 weeks. Call if sugars dropping below 100

## 2023-12-30 ENCOUNTER — Telehealth: Payer: Self-pay | Admitting: Internal Medicine

## 2023-12-30 NOTE — Telephone Encounter (Signed)
 Patient's wife called and states that husband would like to notify doctor that he wants to Stop Ozempic  but before he would like for Dr. Adella to advice him if is okay for him to stop or what she suggests.  Patient's wife states reason for patient wanting to stop ozempic  is because he is having a lot of diarrhea and has poor appetite , patient does not want to eat because he feels nauseated.

## 2024-01-03 ENCOUNTER — Other Ambulatory Visit: Payer: Self-pay

## 2024-01-03 MED ORDER — OZEMPIC (0.25 OR 0.5 MG/DOSE) 2 MG/3ML ~~LOC~~ SOPN: PEN_INJECTOR | SUBCUTANEOUS | 11 refills | Status: DC

## 2024-01-04 NOTE — Telephone Encounter (Signed)
 Discussed yesterday and asked to have him stop Metformin 

## 2024-01-11 ENCOUNTER — Other Ambulatory Visit: Payer: Self-pay

## 2024-01-13 ENCOUNTER — Other Ambulatory Visit: Payer: Self-pay

## 2024-01-13 DIAGNOSIS — E1165 Type 2 diabetes mellitus with hyperglycemia: Secondary | ICD-10-CM

## 2024-01-13 DIAGNOSIS — Z794 Long term (current) use of insulin: Secondary | ICD-10-CM

## 2024-01-16 NOTE — Progress Notes (Signed)
 Ozempic  0.5 mg for 5-6 weeks. Weight 182 lbs Last weight 7/23 at 185 lbs Other DM meds:  Metformin  1000 mg twice daily (did not stop as recommended 2 weeks ago for diarrhea) Lantus  20 units in AM and 15 units at bedtime Jardiance  10 mg daily He feels his symptoms with diarrhea and loss of appetite minimal. Sugars in morning range 110 to 150  Sugars in evening range 107 to 155  Have him cut back on Lantus  by 5 units in both morning and evening--15 units in morning and 10 units in evening. Weight and sugar log in 2 weeks.   See if he is willing to go to 1 mg with Ozempic .

## 2024-02-01 ENCOUNTER — Other Ambulatory Visit: Payer: Self-pay | Admitting: Internal Medicine

## 2024-02-01 NOTE — Progress Notes (Addendum)
 Today weight is 180 lb, the last time his weight was 181 lb. He has lost only one lb.   He is getting Ozempic  0.25 for 2 months along with , Jardiance  , Insulin  Glargine, Metformin ,   Insuline glargine : He is getting 20 units 2 times a day.  AM sugars 116-161 PM sugars 109-163 See if willing to increase Ozempic  to 0.5 mg weekly and make sure he is still taking Metformin 

## 2024-02-10 ENCOUNTER — Other Ambulatory Visit: Payer: Self-pay

## 2024-02-14 ENCOUNTER — Other Ambulatory Visit: Payer: Self-pay

## 2024-02-15 ENCOUNTER — Other Ambulatory Visit: Payer: Self-pay

## 2024-02-15 DIAGNOSIS — E1165 Type 2 diabetes mellitus with hyperglycemia: Secondary | ICD-10-CM

## 2024-02-15 DIAGNOSIS — Z794 Long term (current) use of insulin: Secondary | ICD-10-CM

## 2024-02-16 LAB — HEMOGLOBIN A1C
Est. average glucose Bld gHb Est-mCnc: 177 mg/dL
Hgb A1c MFr Bld: 7.8 % — ABNORMAL HIGH (ref 4.8–5.6)

## 2024-02-20 ENCOUNTER — Ambulatory Visit: Payer: Self-pay | Admitting: Internal Medicine

## 2024-02-21 ENCOUNTER — Ambulatory Visit: Payer: Self-pay | Admitting: Internal Medicine

## 2024-02-21 VITALS — BP 150/90 | HR 89 | Resp 14 | Ht 64.0 in | Wt 173.0 lb

## 2024-02-21 DIAGNOSIS — E1165 Type 2 diabetes mellitus with hyperglycemia: Secondary | ICD-10-CM

## 2024-02-21 DIAGNOSIS — Z794 Long term (current) use of insulin: Secondary | ICD-10-CM

## 2024-02-21 DIAGNOSIS — E785 Hyperlipidemia, unspecified: Secondary | ICD-10-CM

## 2024-02-21 DIAGNOSIS — I10 Essential (primary) hypertension: Secondary | ICD-10-CM

## 2024-02-21 MED ORDER — SEMAGLUTIDE (1 MG/DOSE) 4 MG/3ML ~~LOC~~ SOPN: PEN_INJECTOR | SUBCUTANEOUS | 11 refills | Status: AC

## 2024-02-21 NOTE — Patient Instructions (Addendum)
 Cuando aumente Ozempic  a 1 mg, reduzca Insulina a 15 unidades en la manana y 10 unidades en la tarde. Hable a la oficina si su azucar baja a menos de 100.

## 2024-02-21 NOTE — Progress Notes (Unsigned)
 Subjective:    Patient ID: Thomas Ryan, male   DOB: 09-19-1960, 63 y.o.   MRN: [id number]   HPI  [name] interprets   Hypertension:  states he has taken his lisinopril /hydrochlorothiazide  today and takes regularly.    2.  DM:  A1C almost at goal at 7.8% for first time he has been a patient here.  Had difficulties with diarrhea initially when started Ozempic .  He never stopped his Metformin  as requested and is doing fine now.  His biggest complaint is he does not have energy to work as he is not hungry and does not feel he is getting enough in way of calories.    Morning at 9 a.m.  chicken or other meat, rice and beans.  Shows a decent serving.  Water  Noon:  another good portion of meat.  Salad:  lettuce, lemon and bit of salt.  Water  6 p.m.:  dinner:  sometimes just fruit.  Sometimes similar to other meals  Not eating tortillas, eating whole wheat bread, once piece daily. Stopped soda He has not been seen by eye specialist for diabetic eye exam  3.  Dyslipidemia:  not taking fish oil  regularly.  Cholesterol panel in July was at goal save for very high trigs and low HDL.  He is also taking Mevacor --he is on and off orange card, so on Mevacor  due to cost. Lipid Panel     Component Value Date/Time   CHOL 169 10/07/2023 0919   TRIG 442 (H) 10/07/2023 0919   HDL 34 (L) 10/07/2023 0919   CHOLHDL 4.2 02/06/2016 0915   CHOLHDL 5.1 04/18/2012 1109   VLDL 71 (H) 04/18/2012 1109   LDLCALC 66 10/07/2023 0919   LABVLDL 69 (H) 10/07/2023 0919      Current Meds  Medication Sig   aspirin 81 MG tablet Take 81 mg by mouth daily.   Blood Glucose Monitoring Suppl DEVI May substitute to any manufacturer covered by patient's insurance.  Check blood glucose twice daily before meals   empagliflozin  (JARDIANCE ) 10 MG TABS tablet Take 1 tablet (10 mg total) by mouth daily before breakfast.   gabapentin  (NEURONTIN ) 100 MG capsule TAKE 2 ONCE DAILY IN THE MORNING AND  INCREASE TO 3 CAPSULES BY MOUTH ONCE DAILY IN THE MORNING IN 3 DAYS (Patient taking differently: 200 mg 2 (two) times daily. TAKE 2 ONCE DAILY IN THE MORNING AND INCREASE TO 3 CAPSULES BY MOUTH ONCE DAILY IN THE MORNING IN 3 DAYS)   Glucose Blood (BLOOD GLUCOSE TEST STRIPS) STRP May substitute to any manufacturer covered by patient's insurance.  Check blood glucose twice daily before meals.   insulin  glargine (LANTUS  SOLOSTAR) 100 UNIT/ML Solostar Pen Inject 15 Units into the skin 2 (two) times daily.   Lancet Device MISC May substitute to any manufacturer covered by patient's insurance.  Check blood glucose before meals twice daily.   Lancets Misc. MISC May substitute to any manufacturer covered by patient's insurance.  Check blood glucose twice daily before meals.   lisinopril -hydrochlorothiazide  (ZESTORETIC ) 20-12.5 MG tablet Take 1 tablet by mouth every morning.   lovastatin  (MEVACOR ) 40 MG tablet 1 tab by mouth with evening meal   meloxicam  (MOBIC ) 15 MG tablet 1 tab by mouth with meal once daily for back pain as needed   metFORMIN  (GLUCOPHAGE ) 1000 MG tablet Take 1 tablet (1,000 mg total) by mouth 2 (two) times daily.   Omega-3 Fatty Acids (FISH OIL ) 1000 MG CAPS 2 caps by mouth twice  daily   Semaglutide ,0.25 or 0.5MG /DOS, (OZEMPIC , 0.25 OR 0.5 MG/DOSE,) 2 MG/3ML SOPN Inject 0.5 mg once weekly   No Known Allergies   Review of Systems    Objective:   BP (!) 150/90 (BP Location: Left Arm, Patient Position: Sitting, Cuff Size: Normal)   Pulse 89   Resp 14   Ht 5' 4 (1.626 m)   Wt 173 lb (78.5 kg)   BMI 29.70 kg/m   Physical Exam HENT:     Head: Normocephalic and atraumatic.  Eyes:     Extraocular Movements: Extraocular movements intact.     Pupils: Pupils are equal, round, and reactive to light.  Cardiovascular:     Rate and Rhythm: Normal rate and regular rhythm.     Pulses: Normal pulses.     Heart sounds: S1 normal and S2 normal. No murmur heard.    No friction rub. No  S3 or S4 sounds.  Pulmonary:     Effort: Pulmonary effort is normal.     Breath sounds: Normal breath sounds.  Musculoskeletal:     Cervical back: Normal range of motion and neck supple.     Right lower leg: No edema.     Left lower leg: No edema.  Neurological:     Mental Status: He is alert.      Assessment & Plan   DM:  A1C almost at goal for first time in 10 years   Would like to get him down to Ozempic  and Jardiance  only.  Increase Ozempic  to 1 mg weekly--will not be able to do so until end of Dec/beginning of Jan as with 3 doses left o 0.5 mg.  Follow up mid Jan with sugar log and weight check.   Went over consistency with evening meal and having protein/good fat in that meal as well.  He will decrease long acting insulin  to 15 units in the morning (currently taking 20) and 10 units in evening with increase in Ozempic .  2.  Hypertension:  Not adequately controlled.  Returning for FLP in 2 weeks and will get BP repeat then.  3.  Dyslipidemia:  Should be taking 2000 mg fish oil  twice daily with Mevacor .  FLP in 2 weeks.  Consider switch to atorvastatin as currently with orange card.    4.  HM:  Declined  COVID as has to babysit granddaughter.

## 2024-02-22 ENCOUNTER — Encounter: Payer: Self-pay | Admitting: Internal Medicine

## 2024-03-06 ENCOUNTER — Ambulatory Visit: Payer: Self-pay

## 2024-03-06 VITALS — BP 130/80 | HR 90

## 2024-03-06 NOTE — Progress Notes (Unsigned)
 Bp todady = 130/80 and pulse is 90  The patient is on lisinopril  - Hydrochlotiazid and he had not take that today.

## 2024-04-11 ENCOUNTER — Other Ambulatory Visit: Payer: Self-pay

## 2024-04-11 NOTE — Progress Notes (Unsigned)
 Weight today = 180lbs  last time 172lb   Sugars AM = 110-154  PM = 131-150 The patient takes Jardiance  10mg  , Lantus  15 unit in the morning and 10 in the afternoon, metformin  1000mg  and Ozempic       Call the pharmcy and find out about ozempic 

## 2024-06-22 ENCOUNTER — Other Ambulatory Visit: Payer: Self-pay

## 2024-10-12 ENCOUNTER — Other Ambulatory Visit: Payer: Self-pay

## 2024-10-18 ENCOUNTER — Encounter: Payer: Self-pay | Admitting: Internal Medicine
